# Patient Record
Sex: Female | Born: 1960 | Race: White | Hispanic: No | Marital: Married | Smoking: Former smoker
Health system: Southern US, Community
[De-identification: ages and names within clinical notes are randomized; demographics above are authoritative.]

## PROBLEM LIST (undated history)

## (undated) DIAGNOSIS — F329 Major depressive disorder, single episode, unspecified: Secondary | ICD-10-CM

## (undated) DIAGNOSIS — F419 Anxiety disorder, unspecified: Secondary | ICD-10-CM

## (undated) DIAGNOSIS — F4323 Adjustment disorder with mixed anxiety and depressed mood: Secondary | ICD-10-CM

## (undated) DIAGNOSIS — F1910 Other psychoactive substance abuse, uncomplicated: Secondary | ICD-10-CM

## (undated) DIAGNOSIS — G4730 Sleep apnea, unspecified: Secondary | ICD-10-CM

## (undated) DIAGNOSIS — R21 Rash and other nonspecific skin eruption: Secondary | ICD-10-CM

## (undated) DIAGNOSIS — F1021 Alcohol dependence, in remission: Secondary | ICD-10-CM

## (undated) DIAGNOSIS — I471 Supraventricular tachycardia, unspecified: Secondary | ICD-10-CM

## (undated) DIAGNOSIS — F32A Depression, unspecified: Secondary | ICD-10-CM

## (undated) DIAGNOSIS — R002 Palpitations: Secondary | ICD-10-CM

## (undated) HISTORY — DX: Supraventricular tachycardia: I47.1

## (undated) HISTORY — DX: Other psychoactive substance abuse, uncomplicated: F19.10

## (undated) HISTORY — DX: Depression, unspecified: F32.A

## (undated) HISTORY — DX: Rash and other nonspecific skin eruption: R21

## (undated) HISTORY — DX: Anxiety disorder, unspecified: F41.9

## (undated) HISTORY — DX: Palpitations: R00.2

## (undated) HISTORY — DX: Sleep apnea, unspecified: G47.30

## (undated) HISTORY — DX: Supraventricular tachycardia, unspecified: I47.10

## (undated) HISTORY — DX: Alcohol dependence, in remission: F10.21

## (undated) HISTORY — DX: Adjustment disorder with mixed anxiety and depressed mood: F43.23

---
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
## 1997-12-27 ENCOUNTER — Ambulatory Visit (HOSPITAL_COMMUNITY): Admission: RE | Admit: 1997-12-27 | Discharge: 1997-12-27 | Payer: Self-pay | Admitting: Obstetrics and Gynecology

## 1998-01-31 ENCOUNTER — Encounter: Payer: Self-pay | Admitting: Internal Medicine

## 1998-05-26 ENCOUNTER — Emergency Department (HOSPITAL_COMMUNITY): Admission: EM | Admit: 1998-05-26 | Discharge: 1998-05-26 | Payer: Self-pay | Admitting: Emergency Medicine

## 1998-05-26 ENCOUNTER — Ambulatory Visit (HOSPITAL_COMMUNITY): Admission: RE | Admit: 1998-05-26 | Discharge: 1998-05-26 | Payer: Self-pay | Admitting: Obstetrics and Gynecology

## 1998-08-15 ENCOUNTER — Inpatient Hospital Stay (HOSPITAL_COMMUNITY): Admission: AD | Admit: 1998-08-15 | Discharge: 1998-08-17 | Payer: Self-pay | Admitting: Obstetrics and Gynecology

## 1998-08-22 ENCOUNTER — Encounter (HOSPITAL_COMMUNITY): Admission: RE | Admit: 1998-08-22 | Discharge: 1998-11-20 | Payer: Self-pay | Admitting: Obstetrics and Gynecology

## 1998-09-12 ENCOUNTER — Other Ambulatory Visit: Admission: RE | Admit: 1998-09-12 | Discharge: 1998-09-12 | Payer: Self-pay | Admitting: Obstetrics and Gynecology

## 1999-10-22 ENCOUNTER — Other Ambulatory Visit: Admission: RE | Admit: 1999-10-22 | Discharge: 1999-10-22 | Payer: Self-pay | Admitting: Obstetrics and Gynecology

## 2002-03-16 ENCOUNTER — Other Ambulatory Visit: Admission: RE | Admit: 2002-03-16 | Discharge: 2002-03-16 | Payer: Self-pay | Admitting: Obstetrics and Gynecology

## 2002-11-27 ENCOUNTER — Encounter: Payer: Self-pay | Admitting: Internal Medicine

## 2003-05-09 ENCOUNTER — Other Ambulatory Visit: Admission: RE | Admit: 2003-05-09 | Discharge: 2003-05-09 | Payer: Self-pay | Admitting: Obstetrics and Gynecology

## 2004-08-27 ENCOUNTER — Ambulatory Visit: Payer: Self-pay | Admitting: Internal Medicine

## 2004-09-07 ENCOUNTER — Ambulatory Visit: Payer: Self-pay | Admitting: Internal Medicine

## 2004-09-14 ENCOUNTER — Ambulatory Visit: Payer: Self-pay | Admitting: Internal Medicine

## 2004-09-18 ENCOUNTER — Other Ambulatory Visit: Admission: RE | Admit: 2004-09-18 | Discharge: 2004-09-18 | Payer: Self-pay | Admitting: Obstetrics and Gynecology

## 2004-09-21 ENCOUNTER — Ambulatory Visit: Payer: Self-pay | Admitting: Internal Medicine

## 2006-08-30 ENCOUNTER — Ambulatory Visit: Payer: Self-pay | Admitting: Internal Medicine

## 2006-11-22 ENCOUNTER — Ambulatory Visit: Payer: Self-pay | Admitting: Internal Medicine

## 2006-11-22 DIAGNOSIS — J029 Acute pharyngitis, unspecified: Secondary | ICD-10-CM | POA: Insufficient documentation

## 2006-11-22 LAB — CONVERTED CEMR LAB: Rapid Strep: NEGATIVE

## 2006-11-28 ENCOUNTER — Telehealth: Payer: Self-pay | Admitting: Internal Medicine

## 2007-02-22 ENCOUNTER — Telehealth (INDEPENDENT_AMBULATORY_CARE_PROVIDER_SITE_OTHER): Payer: Self-pay | Admitting: *Deleted

## 2007-04-04 ENCOUNTER — Telehealth: Payer: Self-pay | Admitting: *Deleted

## 2007-04-14 ENCOUNTER — Ambulatory Visit: Payer: Self-pay | Admitting: *Deleted

## 2007-04-19 ENCOUNTER — Ambulatory Visit: Payer: Self-pay | Admitting: *Deleted

## 2007-05-05 ENCOUNTER — Ambulatory Visit: Payer: Self-pay | Admitting: *Deleted

## 2007-05-10 ENCOUNTER — Ambulatory Visit: Payer: Self-pay | Admitting: *Deleted

## 2007-05-17 ENCOUNTER — Ambulatory Visit: Payer: Self-pay | Admitting: *Deleted

## 2007-05-22 ENCOUNTER — Telehealth: Payer: Self-pay | Admitting: Internal Medicine

## 2007-05-31 ENCOUNTER — Ambulatory Visit: Payer: Self-pay | Admitting: *Deleted

## 2007-06-07 ENCOUNTER — Ambulatory Visit: Payer: Self-pay | Admitting: *Deleted

## 2007-06-09 ENCOUNTER — Encounter: Payer: Self-pay | Admitting: Internal Medicine

## 2007-06-21 ENCOUNTER — Ambulatory Visit: Payer: Self-pay | Admitting: *Deleted

## 2007-06-28 ENCOUNTER — Ambulatory Visit: Payer: Self-pay | Admitting: *Deleted

## 2007-07-26 ENCOUNTER — Ambulatory Visit: Payer: Self-pay | Admitting: *Deleted

## 2007-08-07 ENCOUNTER — Telehealth: Payer: Self-pay | Admitting: *Deleted

## 2007-08-29 ENCOUNTER — Telehealth: Payer: Self-pay | Admitting: Internal Medicine

## 2007-09-06 ENCOUNTER — Ambulatory Visit: Payer: Self-pay | Admitting: Internal Medicine

## 2007-09-06 DIAGNOSIS — J069 Acute upper respiratory infection, unspecified: Secondary | ICD-10-CM | POA: Insufficient documentation

## 2007-09-06 DIAGNOSIS — F4323 Adjustment disorder with mixed anxiety and depressed mood: Secondary | ICD-10-CM

## 2007-09-06 DIAGNOSIS — R21 Rash and other nonspecific skin eruption: Secondary | ICD-10-CM

## 2007-09-06 LAB — CONVERTED CEMR LAB: Rapid Strep: NEGATIVE

## 2008-07-29 ENCOUNTER — Telehealth: Payer: Self-pay | Admitting: *Deleted

## 2008-08-21 LAB — CONVERTED CEMR LAB: Pap Smear: NORMAL

## 2008-09-09 ENCOUNTER — Ambulatory Visit: Payer: Self-pay | Admitting: Internal Medicine

## 2008-09-09 DIAGNOSIS — I471 Supraventricular tachycardia: Secondary | ICD-10-CM | POA: Insufficient documentation

## 2008-09-23 LAB — CONVERTED CEMR LAB
ALT: 19 units/L (ref 0–35)
AST: 31 units/L (ref 0–37)
Albumin: 4.3 g/dL (ref 3.5–5.2)
Alkaline Phosphatase: 44 units/L (ref 39–117)
BUN: 24 mg/dL — ABNORMAL HIGH (ref 6–23)
Basophils Absolute: 0 10*3/uL (ref 0.0–0.1)
Basophils Relative: 0 % (ref 0.0–3.0)
Bilirubin, Direct: 0.1 mg/dL (ref 0.0–0.3)
CO2: 30 meq/L (ref 19–32)
Calcium: 10.4 mg/dL (ref 8.4–10.5)
Chloride: 104 meq/L (ref 96–112)
Cholesterol: 250 mg/dL — ABNORMAL HIGH (ref 0–200)
Creatinine, Ser: 0.8 mg/dL (ref 0.4–1.2)
Direct LDL: 132.1 mg/dL
Eosinophils Absolute: 0.1 10*3/uL (ref 0.0–0.7)
Eosinophils Relative: 1.3 % (ref 0.0–5.0)
GFR calc non Af Amer: 81.37 mL/min (ref >60)
Glucose, Bld: 101 mg/dL — ABNORMAL HIGH (ref 70–99)
HCT: 33.4 % — ABNORMAL LOW (ref 36.0–46.0)
HDL: 103.5 mg/dL (ref >39.00)
Hemoglobin: 11.6 g/dL — ABNORMAL LOW (ref 12.0–15.0)
Lymphocytes Relative: 31.5 % (ref 12.0–46.0)
Lymphs Abs: 1.6 10*3/uL (ref 0.7–4.0)
MCHC: 34.8 g/dL (ref 30.0–36.0)
MCV: 102.9 fL — ABNORMAL HIGH (ref 78.0–100.0)
Monocytes Absolute: 0.6 10*3/uL (ref 0.1–1.0)
Monocytes Relative: 12.4 % — ABNORMAL HIGH (ref 3.0–12.0)
Neutro Abs: 2.9 10*3/uL (ref 1.4–7.7)
Neutrophils Relative %: 54.8 % (ref 43.0–77.0)
Platelets: 274 10*3/uL (ref 150.0–400.0)
Potassium: 4.3 meq/L (ref 3.5–5.1)
RBC: 3.25 M/uL — ABNORMAL LOW (ref 3.87–5.11)
RDW: 11.9 % (ref 11.5–14.6)
Sodium: 139 meq/L (ref 135–145)
TSH: 1.37 microintl units/mL (ref 0.35–5.50)
Total Bilirubin: 0.6 mg/dL (ref 0.3–1.2)
Total CHOL/HDL Ratio: 2
Total Protein: 6.7 g/dL (ref 6.0–8.3)
Triglycerides: 75 mg/dL (ref 0.0–149.0)
VLDL: 15 mg/dL (ref 0.0–40.0)
WBC: 5.2 10*3/uL (ref 4.5–10.5)

## 2008-12-05 ENCOUNTER — Telehealth: Payer: Self-pay | Admitting: Internal Medicine

## 2009-01-17 ENCOUNTER — Telehealth: Payer: Self-pay | Admitting: Internal Medicine

## 2009-09-17 ENCOUNTER — Ambulatory Visit: Payer: Self-pay | Admitting: Internal Medicine

## 2009-09-17 DIAGNOSIS — R002 Palpitations: Secondary | ICD-10-CM

## 2010-05-08 ENCOUNTER — Telehealth: Payer: Self-pay | Admitting: *Deleted

## 2010-05-21 ENCOUNTER — Encounter: Payer: Self-pay | Admitting: Internal Medicine

## 2010-05-29 ENCOUNTER — Ambulatory Visit
Admission: RE | Admit: 2010-05-29 | Discharge: 2010-05-29 | Payer: Self-pay | Source: Home / Self Care | Attending: Internal Medicine | Admitting: Internal Medicine

## 2010-05-29 ENCOUNTER — Encounter: Payer: Self-pay | Admitting: Internal Medicine

## 2010-05-29 DIAGNOSIS — R0989 Other specified symptoms and signs involving the circulatory and respiratory systems: Secondary | ICD-10-CM

## 2010-05-29 DIAGNOSIS — R0609 Other forms of dyspnea: Secondary | ICD-10-CM | POA: Insufficient documentation

## 2010-05-29 DIAGNOSIS — D649 Anemia, unspecified: Secondary | ICD-10-CM

## 2010-05-29 DIAGNOSIS — I471 Supraventricular tachycardia: Secondary | ICD-10-CM

## 2010-05-29 DIAGNOSIS — R0683 Snoring: Secondary | ICD-10-CM

## 2010-05-29 DIAGNOSIS — R002 Palpitations: Secondary | ICD-10-CM

## 2010-05-29 NOTE — Progress Notes (Signed)
  Subjective:    Patient ID: Andrea Ortiz, female    DOB: July 08, 1960, 50 y.o.   MRN: [id number] She comes in today for med check .Marland Kitchen She is on metoprolol for  Svt prn palpitations.      This seems to help her symptoms .  NO new cp sob   Needs refill of meds  Also seems to have a chronic problem with snoring recently  A stuffy nose and allergies but  Snoring a good bit.  A sib had a problem with apnea and had to have some kind of nose ENT surgery to correct this.   HPI    Review of Systems Neg for weight change fever anorexia CP sob Asthma symptoms .  No  Bleeding adenopathy vision or hearing changes .   Rest of ros neg or [place]    Objective:   Physical Exam  Constitutional: She is oriented to person, place, and time. Vital signs are normal. She appears well-developed and well-nourished.  Non-toxic appearance. She does not appear ill. No distress.  HENT:  Head: Normocephalic and atraumatic.  Right Ear: External ear normal.  Left Ear: External ear normal.  Nose: Mucosal edema present. No rhinorrhea, sinus tenderness or nasal deformity. Right sinus exhibits no maxillary sinus tenderness. Left sinus exhibits no maxillary sinus tenderness.  Mouth/Throat: Oropharynx is clear and moist. No oropharyngeal exudate.  Eyes: Conjunctivae and EOM are normal. Pupils are equal, round, and reactive to light. Right eye exhibits no discharge. Left eye exhibits no discharge.  Neck: Normal range of motion. Neck supple. No JVD present. No thyromegaly present.  Cardiovascular: Normal rate, regular rhythm, S1 normal, S2 normal, normal heart sounds and intact distal pulses.  Exam reveals no gallop, no friction rub and no decreased pulses.   No murmur heard. Pulmonary/Chest: Effort normal and breath sounds normal. No accessory muscle usage. No respiratory distress. She has no decreased breath sounds. She has no wheezes. She has no rhonchi. She has no rales. She exhibits no tenderness.  Abdominal: Soft. Normal  appearance and bowel sounds are normal. There is no hepatosplenomegaly. There is no tenderness. There is no CVA tenderness.  Musculoskeletal: Normal range of motion.  Lymphadenopathy:    She has no cervical adenopathy.  Neurological: She is alert and oriented to person, place, and time. She has normal strength.  Skin: Skin is warm and dry. She is not diaphoretic. No cyanosis. No pallor. Nails show no clubbing.  Psychiatric: She has a normal mood and affect. Her behavior is normal. Judgment normal. Cognition and memory are normal.          Assessment & Plan:  Palpitations  Svt   On prn meds  Snoring   Add INCS and get ent check  All rhinits    Anemia mild and take oral iron   See Centricity.

## 2010-05-31 LAB — CONVERTED CEMR LAB
ALT: 18 units/L (ref 0–35)
AST: 22 units/L (ref 0–37)
Albumin: 4.5 g/dL (ref 3.5–5.2)
Alkaline Phosphatase: 32 units/L — ABNORMAL LOW (ref 39–117)
BUN: 14 mg/dL (ref 6–23)
Basophils Absolute: 0 10*3/uL (ref 0.0–0.1)
Basophils Relative: 0.6 % (ref 0.0–3.0)
Bilirubin, Direct: 0.1 mg/dL (ref 0.0–0.3)
CO2: 27 meq/L (ref 19–32)
Calcium: 9.4 mg/dL (ref 8.4–10.5)
Chloride: 102 meq/L (ref 96–112)
Cholesterol: 245 mg/dL — ABNORMAL HIGH (ref 0–200)
Creatinine, Ser: 0.7 mg/dL (ref 0.4–1.2)
Direct LDL: 137.9 mg/dL
Eosinophils Absolute: 0.1 10*3/uL (ref 0.0–0.7)
Eosinophils Relative: 1.7 % (ref 0.0–5.0)
Free T4: 0.7 ng/dL (ref 0.6–1.6)
GFR calc non Af Amer: 94.52 mL/min (ref >60)
Glucose, Bld: 89 mg/dL (ref 70–99)
HCT: 32.9 % — ABNORMAL LOW (ref 36.0–46.0)
HDL: 98.2 mg/dL (ref >39.00)
Hemoglobin: 11.4 g/dL — ABNORMAL LOW (ref 12.0–15.0)
Lymphocytes Relative: 35 % (ref 12.0–46.0)
Lymphs Abs: 1.8 10*3/uL (ref 0.7–4.0)
MCHC: 34.7 g/dL (ref 30.0–36.0)
MCV: 103.2 fL — ABNORMAL HIGH (ref 78.0–100.0)
Monocytes Absolute: 0.6 10*3/uL (ref 0.1–1.0)
Monocytes Relative: 11.7 % (ref 3.0–12.0)
Neutro Abs: 2.6 10*3/uL (ref 1.4–7.7)
Neutrophils Relative %: 51 % (ref 43.0–77.0)
Platelets: 294 10*3/uL (ref 150.0–400.0)
Potassium: 4.1 meq/L (ref 3.5–5.1)
RBC: 3.19 M/uL — ABNORMAL LOW (ref 3.87–5.11)
RDW: 13.5 % (ref 11.5–14.6)
Sodium: 137 meq/L (ref 135–145)
T3, Free: 2 pg/mL — ABNORMAL LOW (ref 2.3–4.2)
TSH: 2.12 microintl units/mL (ref 0.35–5.50)
Total Bilirubin: 0.4 mg/dL (ref 0.3–1.2)
Total CHOL/HDL Ratio: 2
Total Protein: 6.8 g/dL (ref 6.0–8.3)
Triglycerides: 95 mg/dL (ref 0.0–149.0)
VLDL: 19 mg/dL (ref 0.0–40.0)
WBC: 5 10*3/uL (ref 4.5–10.5)

## 2010-06-04 NOTE — Progress Notes (Signed)
Summary: Pt req partial refill of Metoprolol to last til ov on 05/29/10  Phone Note Call from Patient Call back at [facility] Phone [phone]   Caller: Patient Summary of Call: Pt has sch a med check ov for 05/29/10 at 2:30pm. Pt will run out of Metoprolol before then and is req a partial to last until appt date. Pls call in to [facility] in [place] [phone] Initial call taken by: Lucy Antigua,  May 08, 2010 1:36 PM  Follow-up for Phone Call        Rx sent to pharmacy. Follow-up by: Romualdo Bolk, CMA (AAMA),  May 08, 2010 2:36 PM    Prescriptions: METOPROLOL TARTRATE 50 MG TABS (METOPROLOL TARTRATE) 1 by mouth once daily  #30 Tablet x 0   Entered by:   Romualdo Bolk, CMA (AAMA)   Authorized by:   Madelin Headings MD   Signed by:   Romualdo Bolk, CMA (AAMA) on 05/08/2010   Method used:   Electronically to        [facility]  [phone]* (retail)       [address]       [place], [place]  [address]       Ph: [phone]       Fax: [phone]   RxID:   [id number]

## 2010-06-04 NOTE — Assessment & Plan Note (Signed)
Summary: med check/refill/cjr   Vital Signs:  Patient profile:   50 year old female Menstrual status:  irregular Weight:      149 pounds BP sitting:   120 / 80  (right arm) CC: Med Recheck/ Refills   History of Present Illness: Andrea Ortiz comesin comes in today  for  follow up of meds for SVT.  Taking  med  metoprolol 50 more often  than usual  and   irregular more than  erratic.  End takes up meds as needed  about 5 days per week .  This is not like racing but more irreg beat .Marland Kitchen No change in activity tolerance and no cp sob. No wakenings   . Had lost  weight  with  .life style intervention and then put it back on.   Allergies on med . No asthma cough.   no other new med problems.   Preventive Screening-Counseling & Management  Alcohol-Tobacco     Alcohol drinks/day: <1     Smoking Status: quit     Year Quit: 12 years ago  Caffeine-Diet-Exercise     Caffeine use/day: 1     Does Patient Exercise: yes     Type of exercise: walking, pilates, yoga and wts     Times/week: 5  Current Medications (verified): 1)  Metoprolol Tartrate 50 Mg Tabs (Metoprolol Tartrate) .Marland Kitchen.. 1 By Mouth Once Daily 2)  Fexofenadine Hcl 180 Mg  Tabs (Fexofenadine Hcl) .Marland Kitchen.. 1 By Mouth Once Daily. Pt Needs To Schedule A Follow Up Appt Before Next Refill. 3)  Desoximetasone 0.05 % Crea (Desoximetasone) .... Apply Two Times A Day For 2 Weeks At A Time  Allergies (verified): 1)  * Lexapro  Past History:  Past medical, surgical, family and social histories (including risk factors) reviewed, and no changes noted (except as noted below).  Past Medical History: Reviewed history from 09/09/2008 and no changes required. 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   cards eval 2000 Childbirth G4 P1  remote hx of eating disorder  and ocd when younger   Family History: Reviewed history from 09/09/2008 and no changes required. Family History of Alcoholism/Addiction Family History Lung cancer Mom alzheimers     Social History: Reviewed history from 09/09/2008 and no changes required. Married   separated Former Lobbyist    Works [employer]  Caffeine use/day:  1  Review of Systems  The patient denies anorexia, fever, weight loss, weight gain, vision loss, decreased hearing, chest pain, syncope, dyspnea on exertion, peripheral edema, prolonged cough, abdominal pain, melena, hematochezia, severe indigestion/heartburn, hematuria, transient blindness, difficulty walking, depression, abnormal bleeding, enlarged lymph nodes, and angioedema.    Physical Exam  General:  Well-developed,well-nourished,in no acute distress; alert,appropriate and cooperative throughout examination Head:  normocephalic and atraumatic.   Eyes:  vision grossly intact, pupils equal, and pupils round.   Ears:  R ear normal, L ear normal, and no external deformities.   Neck:  No deformities, masses, or tenderness noted. Lungs:  Normal respiratory effort, chest expands symmetrically. Lungs are clear to auscultation, no crackles or wheezes. Heart:  Normal rate and regular rhythm. S1 and S2 normal without gallop, murmur, click, rub or other extra sounds.no lifts.   Abdomen:  Bowel sounds positive,abdomen soft and non-tender without masses, organomegaly or   noted. Pulses:  pulses intact without delay   Extremities:  nl cap refill  Neurologic:  non  focal   Skin:  turgor normal, color normal, no ecchymoses, no petechiae, and no  purpura.   Cervical Nodes:  No lymphadenopathy noted Psych:  Oriented X3, normally interactive, good eye contact, not anxious appearing, and not depressed appearing.   EKG nsr   no acute changes   Impression & Recommendations:  Problem # 1:  PALPITATIONS, RECURRENT (ICD-785.1) these soiund more like premature beats than svt.   r/o metabolic derangement. Her updated medication list for this problem includes:    Metoprolol Tartrate 50 Mg Tabs (Metoprolol tartrate) .Marland Kitchen... 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 1 by mouth once daily  Orders: TLB-CBC Platelet - w/Differential (85025-CBCD) TLB-BMP (Basic Metabolic Panel-BMET) (80048-METABOL) TLB-Hepatic/Liver Function Pnl (80076-HEPATIC) TLB-TSH (Thyroid Stimulating Hormone) (84443-TSH) TLB-Lipid Panel (80061-LIPID) TLB-T4 (Thyrox), Free (84439-FT4R) TLB-T3, Free (Triiodothyronine) (84481-T3FREE) EKG w/ Interpretation (93000)  Problem # 3:  ADJ DISORDER WITH MIXED ANXIETY & DEPRESSED MOOD (ICD-309.28)  Complete Medication List: 1)  Metoprolol Tartrate 50 Mg Tabs (Metoprolol tartrate) .Marland Kitchen.. 1 by mouth once daily 2)  Fexofenadine Hcl 180 Mg Tabs (Fexofenadine hcl) .Marland Kitchen.. 1 by mouth once daily. pt needs to schedule a follow up appt before next refill. 3)  Desoximetasone 0.05 % Crea (Desoximetasone) .... Apply two times a day for 2 weeks at a time EKG  Sinus rhythym and nl intervals   sinus brady 58 .  Patient Instructions: 1)  take medication every day  for now 2)  You will be informed of lab results when available.  3)  return office visit in 1-2 months   4)  if persistent or  progressive  consider see cardiology again about opinion  Prescriptions: METOPROLOL TARTRATE 50 MG TABS (METOPROLOL TARTRATE) 1 by mouth once daily  #30 Each x 6   Entered and Authorized by:   Madelin Headings MD   Signed by:   Madelin Headings MD on 09/17/2009   Method used:   Electronically to        [facility]  [phone]*  (retail)       [address]       [place], [place]  [address]       Ph: [phone]       Fax: [phone]   RxID:   [id number]

## 2010-06-06 DIAGNOSIS — D649 Anemia, unspecified: Secondary | ICD-10-CM | POA: Insufficient documentation

## 2010-06-06 DIAGNOSIS — R0683 Snoring: Secondary | ICD-10-CM | POA: Insufficient documentation

## 2010-06-06 NOTE — Patient Instructions (Signed)
See centricity   Patient Instructions: 1)  begin flonase will do ent referral for the snoring. 2)  take an iron supplement  .Marland Kitchen Hg is 11.8  3)  cpx with labs in 7 months

## 2010-06-08 ENCOUNTER — Other Ambulatory Visit: Payer: Self-pay | Admitting: Internal Medicine

## 2010-06-10 NOTE — Assessment & Plan Note (Signed)
Summary: med check/refill/cjr  3-4 x  pwer week.   and using prm after minute  Allegra doing ok. bro sleep apnes  and rx with ent procedure.  Allergies: 1)  * Lexapro note in epic.  Impression & Recommendations:  Problem # 1:  SNORING (ICD-786.09)  see note in epic.  Her updated medication list for this problem includes:    Metoprolol Tartrate 50 Mg Tabs (Metoprolol tartrate) .Marland Kitchen... 1 by mouth once daily  Orders: ENT Referral (ENT)  Problem # 2:  svt using as needed meds   Problem # 3:  ANEMIA (ICD-285.9) h x of same ...  [place]  now is perimenopauseal Orders: Hgb (85018) Fingerstick ([address])  Complete Medication List: 1)  Metoprolol Tartrate 50 Mg Tabs (Metoprolol tartrate) .Marland Kitchen.. 1 by mouth once daily 2)  Fexofenadine Hcl 180 Mg Tabs (Fexofenadine hcl) .Marland Kitchen.. 1 by mouth once daily. pt needs to schedule a follow up appt before next refill. 3)  Desoximetasone 0.05 % Crea (Desoximetasone) .... Apply two times a day for 2 weeks at a time 4)  Fluticasone Propionate 50 Mcg/act Susp (Fluticasone propionate) .... 2 spray each nostril once daily  Patient Instructions: 1)  begin flonase will do ent referral for the snoring. 2)  take an iron supplement  .Marland Kitchen Hg is 11.8  3)  cpx with labs in 7 months  Prescriptions: METOPROLOL TARTRATE 50 MG TABS (METOPROLOL TARTRATE) 1 by mouth once daily  #30 Tablet x 6   Entered and Authorized by:   Madelin Headings MD   Signed by:   Madelin Headings MD on 05/29/2010   Method used:   Electronically to        [facility]  [phone]* (retail)       [address]       [place], [place]  [address]       Ph: [phone]       Fax: [phone]   RxID:   [id number] FLUTICASONE PROPIONATE 50 MCG/ACT SUSP (FLUTICASONE PROPIONATE) 2 spray each nostril once daily  #1 x 3   Entered and Authorized by:   Madelin Headings MD   Signed by:   Madelin Headings MD on 05/29/2010   Method used:   Electronically to        [facility]  [phone]* (retail)       [address]       [place], [place]  [address]       Ph: [phone]       Fax: [phone]   RxID:   [id number]    Orders Added: 1)  Hgb [85018] 2)  Fingerstick [36416] 3)  ENT Referral [ENT] 4)  Est. Patient Level IV [93235]    Laboratory Results   CBC   HGB:  11.8 g/dL   (Normal Range: 57.3-22.0 in Males, 12.0-15.0 in Females) Comments: Andrea Ortiz  May 29, 2010 3:21 PM    Patient ID: Andrea Ortiz, female DOB: 04-07-1961, 50 y.o. MRN: [id number]  She comes in today for med check .Marland Kitchen She is on metoprolol for Svt prn palpitations. This seems to help her symptoms . NO new cp sob Needs refill of meds Also seems to have a chronic problem with snoring recently A stuffy nose and allergies but Snoring a good bit. A sib had a problem with apnea and had to have some kind of nose ENT surgery to correct this. HPI  Review of Systems  Neg for weight change fever anorexia CP sob Asthma symptoms .  No Bleeding adenopathy vision or hearing changes . Rest of ros neg or [place]   Objective: Physical Exam Constitutional: She is oriented to person, place, and time. Vital signs are normal. She appears well-developed and well-nourished. Non-toxic appearance. She does not appear ill. No distress. HENT: Head: Normocephalic and atraumatic. Right Ear: External ear normal. Left Ear: External ear normal. Nose: Mucosal edema present. No rhinorrhea, sinus tenderness or nasal deformity. Right sinus exhibits no maxillary sinus tenderness. Left sinus exhibits no maxillary sinus tenderness. Mouth/Throat: Oropharynx is clear and moist. No oropharyngeal exudate. Eyes: Conjunctivae and EOM are normal. Pupils are equal, round, and reactive to light. Right eye exhibits no discharge. Left eye exhibits no discharge. Neck: Normal range of motion. Neck supple. No JVD present. No thyromegaly present. Cardiovascular: Normal rate, regular rhythm, S1 normal, S2 normal, normal heart sounds and intact distal pulses.  Exam reveals no gallop, no friction rub and no decreased pulses. No murmur heard.  Pulmonary/Chest: Effort normal and breath sounds normal. No accessory muscle usage. No respiratory distress. She has no decreased breath sounds. She has no wheezes. She has no rhonchi. She has no rales. She exhibits no tenderness. Abdominal: Soft. Normal appearance and bowel sounds are normal. There is no hepatosplenomegaly. There is no tenderness. There is no CVA tenderness. Musculoskeletal: Normal range of motion. Lymphadenopathy: She has no cervical adenopathy. Neurological: She is alert and oriented to person, place, and time. She has normal strength. Skin: Skin is warm and dry. She is not diaphoretic. No cyanosis. No pallor. Nails show no clubbing. Psychiatric: She has a normal mood and affect. Her behavior is normal. Judgment normal. Cognition and memory are normal.   Assessment & Plan: Palpitations Svt On prn meds Snoring Add INCS and get ent check All rhinits Anemia mild and take oral iron See Centricity.

## 2010-08-09 ENCOUNTER — Emergency Department (HOSPITAL_BASED_OUTPATIENT_CLINIC_OR_DEPARTMENT_OTHER)
Admission: EM | Admit: 2010-08-09 | Discharge: 2010-08-09 | Disposition: A | Discharge status: Home / Self Care | Payer: 59 | Attending: Emergency Medicine | Admitting: Emergency Medicine

## 2010-08-09 DIAGNOSIS — R002 Palpitations: Secondary | ICD-10-CM | POA: Insufficient documentation

## 2010-08-09 LAB — CBC
Hemoglobin: 11.4 g/dL — ABNORMAL LOW (ref 12.0–15.0)
MCH: 32.7 pg (ref 26.0–34.0)
RBC: 3.49 MIL/uL — ABNORMAL LOW (ref 3.87–5.11)

## 2010-08-09 LAB — DIFFERENTIAL
Basophils Relative: 1 % (ref 0–1)
Lymphocytes Relative: 33 % (ref 12–46)
Lymphs Abs: 1.5 10*3/uL (ref 0.7–4.0)
Monocytes Relative: 11 % (ref 3–12)
Neutro Abs: 2.4 10*3/uL (ref 1.7–7.7)
Neutrophils Relative %: 54 % (ref 43–77)

## 2010-08-09 LAB — BASIC METABOLIC PANEL
CO2: 25 mEq/L (ref 19–32)
Chloride: 105 mEq/L (ref 96–112)
Creatinine, Ser: 0.7 mg/dL (ref 0.4–1.2)
GFR calc Af Amer: >60 mL/min (ref >60)

## 2010-08-10 ENCOUNTER — Telehealth: Payer: Self-pay | Admitting: *Deleted

## 2010-08-10 NOTE — Telephone Encounter (Signed)
[facility] Triage Call Report Triage Record Num: [id number] Operator: Ether Griffins Patient Name: Andrea Ortiz Call Date & Time: 08/09/2010 11:26:30AM Patient Phone: [phone] PCP: Neta Mends. Panosh Patient Gender: Female PCP Fax : [phone] Patient DOB: 1960-10-17 Practice Name: [facility] Reason for Call: Calling about having heart skipping a beat and SOB--has been happening often since last night.Onset 08/09/10.Takes Metoprolol.Some lightheadedness but denies CP.Advised to go to ED. Protocol(s) Used: Irregular Heartbeat Recommended Outcome per Protocol: See ED Immediately Reason for Outcome: Pulse rate is suddenly more irregular or suddenly >90 beats/minute at rest AND known coronary artery disease (history of MI, angina or cardiac surgery) Care Advice: ~ Another adult should drive. ~ IMMEDIATE ACTION Write down provider's name. List or place the following in a bag for transport with the patient: current prescription and/or nonprescription medications; alternative treatments, therapies and medications; and street drugs. ~ ~ Place person in a position of comfort and loosen tight clothing. Call EMS 911 if having chest pain, sudden severe shortness of breath, loss of consciousness, or signs of shock (such as unable to stand due to faintness, dizziness, or lightheadedness; new onset of confusion; slow to respond or difficult to awaken; skin is pale, gray, cool, or moist to touch; severe weakness). ~ 08/09/2010 11:35:26AM Page 1 of 1 [id number]

## 2010-08-10 NOTE — Telephone Encounter (Signed)
Pt is taking Metoprolol and appt was given next week.

## 2010-08-10 NOTE — Telephone Encounter (Signed)
Pt was seen in the ER at [facility] for SVT and was diagnosed with benign palpitations.  Is asking if we need any records or appt for her???

## 2010-08-10 NOTE — Telephone Encounter (Signed)
Per Dr. Fabian Sharp- If doing okay then ov next week. Make sure she is taking metoprolol daily.

## 2010-08-12 ENCOUNTER — Encounter: Payer: Self-pay | Admitting: Family Medicine

## 2010-08-12 ENCOUNTER — Ambulatory Visit (INDEPENDENT_AMBULATORY_CARE_PROVIDER_SITE_OTHER): Payer: 59 | Admitting: Family Medicine

## 2010-08-12 VITALS — BP 120/62 | Temp 98.4°F | Ht 65.75 in | Wt 146.0 lb

## 2010-08-12 DIAGNOSIS — I471 Supraventricular tachycardia: Secondary | ICD-10-CM

## 2010-08-12 DIAGNOSIS — R002 Palpitations: Secondary | ICD-10-CM

## 2010-08-12 NOTE — Patient Instructions (Signed)
Gradually reduce caffeine use. Take Lopressor 50 mg one half tablet twice daily.

## 2010-08-12 NOTE — Progress Notes (Signed)
  Subjective:    Patient ID: Andrea Ortiz, female    DOB: 1961-02-20, 50 y.o.   MRN: [id number]  HPI Patient seen for followup with chief complaint of some palpitations over the past few days. Onset last Thursday. Symptoms at rest. Prior history of PSVT that these symptoms are different. Sensation of occasional skipped or irregular heartbeat. Still exercising without difficulty. No associated chest pain, syncope, or dizziness.  Patient went to emergency department and monitoring was unremarkable. EKG normal sinus rhythm. Electrolytes normal. CBC hemoglobin 11.4 consistent with prior values no other changes. Previous thyroid functions normal. No recent symptoms of weight loss, diarrhea, or tremors.  Drinks caffeine in the form of coffee 2 cups per day and occasional green tea. No recent dietary changes.  She takes Lopressor 50 mg once daily at baseline. Increased on her own to 2 tablets once daily but has had some increased fatigue and feeling less focused since then. She has not taken her pulse.   Review of Systems  Constitutional: Positive for fatigue. Negative for fever, chills, diaphoresis, appetite change and unexpected weight change.  HENT: Negative for neck stiffness.   Respiratory: Negative for cough, chest tightness and shortness of breath.   Cardiovascular: Positive for palpitations. Negative for chest pain and leg swelling.  Gastrointestinal: Negative for abdominal pain.  Genitourinary: Negative for dysuria.  Neurological: Negative for dizziness, syncope, weakness and headaches.  Hematological: Negative for adenopathy.       Objective:   Physical Exam  Constitutional: She is oriented to person, place, and time. She appears well-developed and well-nourished.  HENT:  Head: Normocephalic.  Right Ear: External ear normal.  Left Ear: External ear normal.  Mouth/Throat: Oropharynx is clear and moist. No oropharyngeal exudate.  Eyes: Pupils are equal, round, and reactive to  light.  Neck: Neck supple. No thyromegaly present.  Cardiovascular: Normal rate, regular rhythm and normal heart sounds.  Exam reveals no gallop.   No murmur heard. Pulmonary/Chest: Effort normal and breath sounds normal. No respiratory distress. She has no wheezes. She has no rales.  Musculoskeletal: She exhibits no edema.  Lymphadenopathy:    She has no cervical adenopathy.  Neurological: She is alert and oriented to person, place, and time.  Psychiatric: She has a normal mood and affect.          Assessment & Plan:  #1 palpitations. No evidence for recurrent PSVT. Recent lab work unremarkable. Gradually reduce caffeine use. Suggested Lopressor 50 mg one half tablet twice a day. Followup with cardiologist or consideration of Holter monitor if symptoms persist #2 history of PSVT with no recent occurrence

## 2010-08-18 ENCOUNTER — Ambulatory Visit: Payer: 59 | Admitting: Internal Medicine

## 2010-11-06 ENCOUNTER — Telehealth: Payer: Self-pay | Admitting: *Deleted

## 2010-11-06 MED ORDER — FLUCONAZOLE 150 MG PO TABS: 150.0000 mg | ORAL_TABLET | Freq: Once | ORAL | Status: AC

## 2010-11-06 NOTE — Telephone Encounter (Signed)
Ok to do Diflucan 150 mg   X 1

## 2010-11-06 NOTE — Telephone Encounter (Signed)
Notified pt. 

## 2010-11-06 NOTE — Telephone Encounter (Signed)
Pt states she is sure that she has a yeast infection, and took one day Monistat treatment which did not work.  Wants suggestions, and will use a longer treatment of Monistat if she needs to .

## 2010-11-11 ENCOUNTER — Telehealth: Payer: Self-pay | Admitting: *Deleted

## 2010-11-11 MED ORDER — TERCONAZOLE 0.8 % VA CREA: 1.0000 | TOPICAL_CREAM | Freq: Every day | VAGINAL | Status: AC

## 2010-11-11 NOTE — Telephone Encounter (Signed)
Pt took the one Diflucan, but is still having vaginal itching.  Asking for extended treatment of Diflucan or whatever Dr. Fabian Sharp recommends.  No discharge of any other symptoms.

## 2010-11-11 NOTE — Telephone Encounter (Signed)
Left message on personally identified voicemail to notify pt rx sent to pharmacy

## 2010-11-11 NOTE — Telephone Encounter (Signed)
terazole. vaginal  And if not better then needs ov evaluation.

## 2011-01-06 ENCOUNTER — Encounter: Payer: Self-pay | Admitting: Internal Medicine

## 2011-01-07 ENCOUNTER — Ambulatory Visit: Payer: 59 | Admitting: Internal Medicine

## 2011-01-07 ENCOUNTER — Ambulatory Visit (INDEPENDENT_AMBULATORY_CARE_PROVIDER_SITE_OTHER): Payer: 59 | Admitting: Internal Medicine

## 2011-01-07 ENCOUNTER — Encounter: Payer: Self-pay | Admitting: Internal Medicine

## 2011-01-07 VITALS — BP 120/80 | HR 78 | Wt 138.0 lb

## 2011-01-07 DIAGNOSIS — R002 Palpitations: Secondary | ICD-10-CM

## 2011-01-07 DIAGNOSIS — M5440 Lumbago with sciatica, unspecified side: Secondary | ICD-10-CM | POA: Insufficient documentation

## 2011-01-07 DIAGNOSIS — R209 Unspecified disturbances of skin sensation: Secondary | ICD-10-CM

## 2011-01-07 DIAGNOSIS — M5430 Sciatica, unspecified side: Secondary | ICD-10-CM

## 2011-01-07 DIAGNOSIS — R202 Paresthesia of skin: Secondary | ICD-10-CM | POA: Insufficient documentation

## 2011-01-07 DIAGNOSIS — D649 Anemia, unspecified: Secondary | ICD-10-CM

## 2011-01-07 LAB — CBC WITH DIFFERENTIAL/PLATELET
Basophils Relative: 0.6 % (ref 0.0–3.0)
Eosinophils Absolute: 0.1 10*3/uL (ref 0.0–0.7)
Eosinophils Relative: 1.1 % (ref 0.0–5.0)
Hemoglobin: 11.9 g/dL — ABNORMAL LOW (ref 12.0–15.0)
Lymphocytes Relative: 30.1 % (ref 12.0–46.0)
MCHC: 33.7 g/dL (ref 30.0–36.0)
Monocytes Relative: 9.9 % (ref 3.0–12.0)
Neutro Abs: 2.7 10*3/uL (ref 1.4–7.7)
Neutrophils Relative %: 58.3 % (ref 43.0–77.0)
RBC: 3.43 Mil/uL — ABNORMAL LOW (ref 3.87–5.11)
WBC: 4.6 10*3/uL (ref 4.5–10.5)

## 2011-01-07 LAB — TSH: TSH: 1.35 u[IU]/mL (ref 0.35–5.50)

## 2011-01-07 LAB — T4, FREE: Free T4: 0.67 ng/dL (ref 0.60–1.60)

## 2011-01-07 LAB — SEDIMENTATION RATE: Sed Rate: 14 mm/hr (ref 0–22)

## 2011-01-07 MED ORDER — METOPROLOL TARTRATE 50 MG PO TABS: 50.0000 mg | ORAL_TABLET | Freq: Every day | ORAL | Status: DC

## 2011-01-07 NOTE — Patient Instructions (Signed)
Will notify you  of labs when available. Will get you a cards referral.  Also will plan and evaluation for the tingling and spinal pain. This seems like a pinched nerve  As opposed to other disease.

## 2011-01-07 NOTE — Progress Notes (Signed)
  Subjective:    Patient ID: Andrea Ortiz, female    DOB: 03/29/1961, 50 y.o.   MRN: [id number]  HPI Comes in today for follow up of continued intermittent palpitations. She is now taking b blocker every day split bid dosing. No se of meds .   Unclear if helps . No syncope chest pain cough . No bleeding or bruising . Worse when stressed .   Caffine in am  A lot in the past less now. Exercise 5 days  Per week.   Not worse with exercise .  See ed visit from the spring. Remote hx of svt when pregnant in the past.  Also concerned about right back hip area  Pain the ocass radiated to leg.  Intermittently gets tingling electric feeling various limbs but no weakness or all at the same time.    Of turns neck a certain way the gets tingling and pain down right arm.  No hx of traumea or djd disc disease. No bowel or bladder associated sx.  Hand eczema    Review of Systems Neg fever gi gu issues hearing vision change weakness  Still snores some  No OSA sx . See hpi  Past history family history social history reviewed in the electronic medical record.     Objective:   Physical Exam Physical Exam: Vital signs reviewed WUJ:[id number] is a well-developed well-nourished alert cooperative  white female who appears her stated age in no acute distress.  HEENT: normocephalic  atraumatic , Eyes: PERRL EOM's full, conjunctiva clear, Nares: paten,t no deformity discharge or tenderness., Ears: no deformity EAC's clear TMs with normal landmarks. Mouth: clear OP, no lesions, edema.  Moist mucous membranes. Dentition in adequate repair. NECK: supple without masses, thyromegaly or bruits. CHEST/PULM:  Clear to auscultation and percussion breath sounds equal no wheeze , rales or rhonchi. No chest wall deformities or tenderness. CV: PMI is nondisplaced, S1 S2 no gallops, murmurs, rubs. Peripheral pulses are full without delay.No JVD .  ABDOMEN: Bowel sounds normal nontender  No guard or rebound, no hepato splenomegal  no CVA tenderness.   Extremtities:  No clubbing cyanosis or edema, no acute joint swelling or redness no focal atrophy NEURO:  Oriented x3, cranial nerves 3-12 appear to be intact, no obvious focal weakness,gait within normal limits no abnormal reflexes  Sensation not tested  SKIN: No acute rashes normal turgor, color, no bruising or petechiae. PSYCH: Oriented, good eye contact, no obvious depression anxiety, cognition and judgment appear normal.      Assessment & Plan:  Palpitations recurrent; Sound like premature beats .    Hx of svt in remote past associated with preg.  Had ed visit and no thyroid tests done at that time. No  Other sx  .  Disc options  Will get  Cards opinion about advisability of further eval.  May want to limit  Caffiene.   Back neck pain with various radiations  That sound neuro. No current weakness or severity but  Problematic at times .

## 2011-01-11 ENCOUNTER — Telehealth: Payer: Self-pay | Admitting: *Deleted

## 2011-01-11 ENCOUNTER — Encounter: Payer: Self-pay | Admitting: Neurology

## 2011-01-11 DIAGNOSIS — D649 Anemia, unspecified: Secondary | ICD-10-CM

## 2011-01-11 NOTE — Telephone Encounter (Signed)
Left message on machine to call back and schedule a lab appt. I also left a message on machine about results.

## 2011-01-11 NOTE — Telephone Encounter (Signed)
Message copied by Romualdo Bolk on Mon Jan 11, 2011  9:25 AM ------      Message from: [facility], [place] [facility]      Created: Mon Jan 11, 2011  8:28 AM       Tell patient normal labs including thyroid  except  For some anemia .             Advise  : CBCdiff ,IBC panel , ferritin and  VIT B12 and Folic acid level :  Dx anemia .      Then decide further follow up .     ( consults are pending)

## 2011-01-12 ENCOUNTER — Ambulatory Visit: Payer: 59 | Admitting: Internal Medicine

## 2011-01-20 ENCOUNTER — Ambulatory Visit (INDEPENDENT_AMBULATORY_CARE_PROVIDER_SITE_OTHER): Payer: 59 | Admitting: Neurology

## 2011-01-20 ENCOUNTER — Encounter: Payer: Self-pay | Admitting: Neurology

## 2011-01-20 VITALS — BP 126/84 | HR 64 | Ht 67.0 in | Wt 139.0 lb

## 2011-01-20 DIAGNOSIS — M545 Low back pain, unspecified: Secondary | ICD-10-CM

## 2011-01-20 NOTE — Progress Notes (Signed)
Dear Dr. Fabian Sharp,  Thank you for having me see Andrea Ortiz in consultation today at [facility] for her problem with lower back pain.  As you may recall, she is a 50 y.o. year old female with a benign medical history who presents with worsening lower back pain with bilateral numbness and tingling of legs on exam.  She says that it has been getting progressively worse over the last 6 months.  It is relieved by bending forward and she doesn't have any bowel or bladder incontinence.  There was no precipitating trauma.  Coughing does not make it worse.  She describes the numbness and tingling as being bilateral, worse on the left outer part of the foot.  Medical History: Supraventricular tachycardia.  Surgical History: Rhinoplasty   Social History: Works as a Librarian, academic.  Social EtOH. No Tob.  Former Education officer, community.   Family History: History of dementia and parkinson's disease.  No other neurologic diseases.   ROS:  13 systems were reviewed and are notable for leg pain while walking, anxiety, headaches.  She describes the headaches as bifrontal, without photo or phonophobia and relieved with advil.  All other review of systems are unremarkable.   Examination:  Filed Vitals:   01/20/11 0808  BP: 126/84  Pulse: 64  Height: 5\' 7"  (1.702 m)  Weight: 139 lb (63.05 kg)     In general, well appearing middle aged woman.  Back:  No neck tenderness to palpation of step deformities.  Same for lower back.  Decreased ROM of neck particularly in retroflexion.  Cardiovascular: The patient has a regular rate and rhythm and no carotid bruits.  Fundoscopy:  Disks are flat. Vessel caliber within normal limits.  Mental status:   The patient is oriented to person, place and time. Recent and remote memory are intact. Attention span and concentration are normal. Language including repetition, naming, following commands are intact. Fund of knowledge of current and historical events,  as well as vocabulary are normal.  Cranial Nerves: Pupils are equally round and reactive to light. Visual fields full to confrontation. Extraocular movements are intact without nystagmus. Facial sensation and muscles of mastication are intact. Muscles of facial expression are symmetric. Hearing intact to bilateral finger rub. Tongue protrusion, uvula, palate midline.  Shoulder shrug intact  Motor:  The patient has normal bulk and tone, no pronator drift and 5/5 strength bilaterally.  There are no adventitious movements.  Reflexes:  Are 2+ bilaterally in both the upper and lower extremities.  Except 1+ in the left ankle.  Toes down.  Coordination:  Normal finger to nose.  No dysdiadokinesia.  Sensation is decreased to temperature in the left L5, S1 distribution.  Gait and Station are normal.  Tandem gait is intact.  Romberg is negative   Impression: Likely lumbosacral radiculopathy.  There is a hint of neurogenic claudication here so I am a little worried about lumbar stenosis.   Recommendations:  1.  MRI of the L-spine 2.  Left leg EMG/NCS   We will see the patient back in 1 month after her investigations.  Thank you for having [place] see this patient in consultation.  Feel free to contact me with any questions.  Andrea Raider Modesto Charon, MD [facility],  [address] [place], [place] [address] Phone: [phone] Fax: [phone].

## 2011-01-20 NOTE — Patient Instructions (Signed)
Your MRI has been scheduled for Tuesday, Sept. 25th at 8:00am please arrive to [facility] by 7:45am.  The nerve conduction studies/electromyelogram is scheduled for Monday, October 8th at 9:45am.  [facility] [address] [place].  [phone].

## 2011-01-26 ENCOUNTER — Inpatient Hospital Stay (HOSPITAL_COMMUNITY): Admission: RE | Admit: 2011-01-26 | Payer: 59 | Source: Ambulatory Visit

## 2011-01-27 ENCOUNTER — Other Ambulatory Visit: Payer: Self-pay | Admitting: Internal Medicine

## 2011-02-03 ENCOUNTER — Institutional Professional Consult (permissible substitution): Payer: 59 | Admitting: Cardiovascular Disease

## 2011-02-09 ENCOUNTER — Encounter: Payer: Self-pay | Admitting: Cardiovascular Disease

## 2011-02-09 ENCOUNTER — Ambulatory Visit (INDEPENDENT_AMBULATORY_CARE_PROVIDER_SITE_OTHER): Payer: 59 | Admitting: Cardiovascular Disease

## 2011-02-09 DIAGNOSIS — R002 Palpitations: Secondary | ICD-10-CM

## 2011-02-09 DIAGNOSIS — R079 Chest pain, unspecified: Secondary | ICD-10-CM | POA: Insufficient documentation

## 2011-02-09 DIAGNOSIS — I471 Supraventricular tachycardia: Secondary | ICD-10-CM

## 2011-02-09 NOTE — Progress Notes (Signed)
Pleasant 50 yo referred by Panosh for palpitaitons.  Lots of recent changes with new job, new hom, new marriage and going through [place] with hot flashes.  History of SVT 12 years ago broke in ER with lopresser.  Was pregnant at time.  Since then has had flairs of palpitaitons intermitantly.  Worse last couple of months with associated mid sternal pain described as pinching.  No rest pain.  No presyncope or long runs of palpitations.  Skips a lot not worse with exercise.  Has two glasses of wine most nights.  Thyroid checked recently and ok.    ROS: Denies fever, malais, weight loss, blurry vision, decreased visual acuity, cough, sputum, SOB, hemoptysis, pleuritic pain, palpitaitons, heartburn, abdominal pain, melena, lower extremity edema, claudication, or rash.  All other systems reviewed and negative   General: Affect appropriate Healthy:  appears stated age HEENT: normal Neck supple with no adenopathy JVP normal no bruits no thyromegaly Lungs clear with no wheezing and good diaphragmatic motion Heart:  S1/S2 no murmur,rub, gallop or click PMI normal Abdomen: benighn, BS positve, no tenderness, no AAA no bruit.  No HSM or HJR Distal pulses intact with no bruits No edema Neuro non-focal Skin warm and dry No muscular weakness  Medications Current Outpatient Prescriptions  Medication Sig Dispense Refill  . desoximetasone (TOPICORT) 0.05 % cream Apply topically 2 (two) times daily. For 2 weeks at a time       . fexofenadine (ALLEGRA) 180 MG tablet Take 180 mg by mouth daily.        . metoprolol (LOPRESSOR) 50 MG tablet TAKE 1 TABLET BY MOUTH ONCE DAILY  30 tablet  5    Allergies Escitalopram oxalate  Family History: Family History  Problem Relation Age of Onset  . Alzheimer's disease Mother   . Heart attack Mother     age 4's  . Parkinsonism Father   . Other Sister     Hypoglycemia    Social History: History   Social History  . Marital Status: Married    Spouse  Name: N/A    Number of Children: N/A  . Years of Education: N/A   Occupational History  . Not on file.   Social History Main Topics  . Smoking status: Former Smoker -- 1.5 packs/day for 15 years    Types: Cigarettes    Quit date: 08/12/1995  . Smokeless tobacco: Never Used  . Alcohol Use: 3.0 oz/week    5 Glasses of wine per week  . Drug Use: Not on file  . Sexually Active: Not on file   Other Topics Concern  . Not on file   Social History Narrative   Divorced- Remarried- Husband has a son at [place] child [employer]    Electrocardiogram: 08/10/10 NsR normal ECG  Assessment and Plan

## 2011-02-09 NOTE — Patient Instructions (Signed)
Your physician recommends that you schedule a follow-up appointment in: 6-8 WEEKS  Your physician has recommended that you wear an event monitor. Event monitors are medical devices that record the heart's electrical activity. Doctors most often [place] these monitors to diagnose arrhythmias. Arrhythmias are problems with the speed or rhythm of the heartbeat. The monitor is a small, portable device. You can wear one while you do your normal daily activities. This is usually used to diagnose what is causing palpitations/syncope (passing out).   Your physician has requested that you have a stress echocardiogram. For further information please visit [email or web address] Please follow instruction sheet as given.

## 2011-02-09 NOTE — Assessment & Plan Note (Signed)
Atypical  F/U stress echo   

## 2011-02-09 NOTE — Assessment & Plan Note (Signed)
Nonrecurrent no need for EP referral

## 2011-02-09 NOTE — Assessment & Plan Note (Signed)
Event monitor continue lopresser for now.

## 2011-02-18 ENCOUNTER — Encounter (INDEPENDENT_AMBULATORY_CARE_PROVIDER_SITE_OTHER): Payer: 59

## 2011-02-18 ENCOUNTER — Ambulatory Visit (HOSPITAL_COMMUNITY): Payer: 59 | Attending: Cardiovascular Disease | Admitting: Radiology

## 2011-02-18 ENCOUNTER — Ambulatory Visit (HOSPITAL_BASED_OUTPATIENT_CLINIC_OR_DEPARTMENT_OTHER): Payer: 59 | Admitting: Radiology

## 2011-02-18 DIAGNOSIS — R0609 Other forms of dyspnea: Secondary | ICD-10-CM | POA: Insufficient documentation

## 2011-02-18 DIAGNOSIS — R002 Palpitations: Secondary | ICD-10-CM

## 2011-02-18 DIAGNOSIS — R0989 Other specified symptoms and signs involving the circulatory and respiratory systems: Secondary | ICD-10-CM

## 2011-02-18 DIAGNOSIS — Z8249 Family history of ischemic heart disease and other diseases of the circulatory system: Secondary | ICD-10-CM | POA: Insufficient documentation

## 2011-02-18 DIAGNOSIS — R072 Precordial pain: Secondary | ICD-10-CM | POA: Insufficient documentation

## 2011-02-18 DIAGNOSIS — R5381 Other malaise: Secondary | ICD-10-CM | POA: Insufficient documentation

## 2011-02-22 NOTE — Progress Notes (Signed)
EMG/NCS done 02/15/2011 revealed mild left S1 radiculopathy that is acute.  Waiting for results of MRI l-spine.

## 2011-02-25 ENCOUNTER — Encounter: Payer: Self-pay | Admitting: Neurology

## 2011-02-25 ENCOUNTER — Ambulatory Visit (HOSPITAL_COMMUNITY)
Admission: RE | Admit: 2011-02-25 | Discharge: 2011-02-25 | Disposition: A | Discharge status: Home / Self Care | Payer: 59 | Source: Ambulatory Visit | Attending: Neurology | Admitting: Neurology

## 2011-02-25 DIAGNOSIS — M545 Low back pain: Secondary | ICD-10-CM

## 2011-02-25 DIAGNOSIS — M51379 Other intervertebral disc degeneration, lumbosacral region without mention of lumbar back pain or lower extremity pain: Secondary | ICD-10-CM | POA: Insufficient documentation

## 2011-02-25 DIAGNOSIS — M5137 Other intervertebral disc degeneration, lumbosacral region: Secondary | ICD-10-CM | POA: Insufficient documentation

## 2011-03-02 NOTE — Progress Notes (Signed)
LM on patient's cell phone to call us to schedule her fu appt.

## 2011-03-08 ENCOUNTER — Encounter: Payer: Self-pay | Admitting: Neurology

## 2011-03-16 ENCOUNTER — Encounter: Payer: Self-pay | Admitting: Neurology

## 2011-03-16 NOTE — Progress Notes (Signed)
I was never able to get in touch with Ms. Andrea Ortiz by phone, so I mailed her a letter asking her to call the office and schedule the fu appt.

## 2011-03-22 NOTE — Progress Notes (Signed)
Fu appt sched 04/23/11 at 3:00 pm.

## 2011-04-06 ENCOUNTER — Encounter: Payer: Self-pay | Admitting: *Deleted

## 2011-04-08 ENCOUNTER — Ambulatory Visit: Payer: 59 | Admitting: Cardiovascular Disease

## 2011-04-23 ENCOUNTER — Encounter: Payer: Self-pay | Admitting: Neurology

## 2011-04-23 ENCOUNTER — Ambulatory Visit (INDEPENDENT_AMBULATORY_CARE_PROVIDER_SITE_OTHER): Payer: 59 | Admitting: Neurology

## 2011-04-23 VITALS — BP 136/84 | HR 96 | Wt 140.0 lb

## 2011-04-23 DIAGNOSIS — IMO0002 Reserved for concepts with insufficient information to code with codable children: Secondary | ICD-10-CM

## 2011-04-23 DIAGNOSIS — M5416 Radiculopathy, lumbar region: Secondary | ICD-10-CM

## 2011-04-23 MED ORDER — GABAPENTIN 300 MG PO CAPS: 300.0000 mg | ORAL_CAPSULE | Freq: Three times a day (TID) | ORAL | Status: DC

## 2011-04-23 NOTE — Progress Notes (Signed)
Dear Dr. Fabian Sharp,  I saw  Andrea Ortiz back in [facility] for her problem with numbness, bilateral leg pain and back pain.  As you may recall, she is a 50 y.o. year old female with a history of over 6 months of worsening lower back pain and bilateral numbness and tingling of the legs.  It is worse on the outer left part of the foot.    I got an EMG/NCS of her left lower extremity, which did not reveal any peripheral neuropathy but did reveal a chronic S1 radiculopathy.  I got an MRI of her spine which revealed lumbar facet arthropathy and degenerative disk disease with mild right subarticular lateral recess stensosis at L4-L5.  Right paracentral disk protrusion at L1-L2 that did not compress L2 nerve root.  She says her pain is about the same.  Worse with exercise, but not getting in the way of her function.  Medical history, social history, and family history were reviewed and have not changed since the last clinic visit.  Current Outpatient Prescriptions on File Prior to Visit  Medication Sig Dispense Refill  . desoximetasone (TOPICORT) 0.05 % cream Apply topically 2 (two) times daily. For 2 weeks at a time       . fexofenadine (ALLEGRA) 180 MG tablet Take 180 mg by mouth daily.        . metoprolol (LOPRESSOR) 50 MG tablet TAKE 1 TABLET BY MOUTH ONCE DAILY  30 tablet  5    Allergies  Allergen Reactions  . Escitalopram Oxalate     REACTION: hand s swelled up.    ROS:  13 systems were reviewed and were  unremarkable.  Exam: . Filed Vitals:   04/23/11 1453  BP: 136/84  Pulse: 96  Weight: 140 lb (63.504 kg)    In general, very well appearing women.    Motor:  Normal bulk and tone, no drift and 5/5 muscle strength bilaterally.  Reflexes:  2+ thoughout, toes down, except 1+ left ankle.  Straight leg raise negative bilaterally.  Impression/Recs: ?Bilateral radiculopathies, worse on the left, however, EMG findings of left S1 radic are not supported by imaging  findings.  I explained to her that she may benefit from epidural steroid injection or facet injection.  She would like to try Neurontin for now and I have given her a directions to increase to 300 tid.  She will call me to let me know if this helps.  We can continue to increase it or can try Lyrica or will refer her to interventional pain.   Andrea Ortiz Modesto Charon, MD [facility], [place]

## 2011-04-23 NOTE — Patient Instructions (Signed)
Gabapentin 300mg  caps  Take 1 cap once a day for 1 week,  then 1 cap twice a day for 1 week, then 1 cap three times a day from then on.

## 2012-02-02 MED ORDER — METOPROLOL TARTRATE 50 MG PO TABS: 50.0000 mg | ORAL_TABLET | Freq: Every day | ORAL | Status: DC

## 2012-02-16 ENCOUNTER — Other Ambulatory Visit: Payer: Self-pay | Admitting: Internal Medicine

## 2012-02-16 NOTE — Telephone Encounter (Signed)
Pt needs refill on lopressor call into [facility] [address] Pt has appt on 03-02-2012

## 2012-02-16 NOTE — Telephone Encounter (Signed)
Left message to advise pt that rx was sent to [facility] in [place] on 02/02/12. Advised pt to call back if it cannot be picked up form that pharmacy

## 2012-03-02 ENCOUNTER — Ambulatory Visit: Payer: 59 | Admitting: Internal Medicine

## 2012-03-14 ENCOUNTER — Ambulatory Visit (INDEPENDENT_AMBULATORY_CARE_PROVIDER_SITE_OTHER): Payer: 59 | Admitting: Internal Medicine

## 2012-03-14 ENCOUNTER — Encounter: Payer: Self-pay | Admitting: Internal Medicine

## 2012-03-14 VITALS — BP 128/84 | HR 84 | Temp 98.2°F | Wt 142.0 lb

## 2012-03-14 DIAGNOSIS — D649 Anemia, unspecified: Secondary | ICD-10-CM

## 2012-03-14 DIAGNOSIS — Z23 Encounter for immunization: Secondary | ICD-10-CM

## 2012-03-14 DIAGNOSIS — R202 Paresthesia of skin: Secondary | ICD-10-CM

## 2012-03-14 DIAGNOSIS — R209 Unspecified disturbances of skin sensation: Secondary | ICD-10-CM

## 2012-03-14 DIAGNOSIS — I471 Supraventricular tachycardia: Secondary | ICD-10-CM

## 2012-03-14 LAB — CBC WITH DIFFERENTIAL/PLATELET
Basophils Relative: 0.7 % (ref 0.0–3.0)
Eosinophils Relative: 2 % (ref 0.0–5.0)
Hemoglobin: 11.8 g/dL — ABNORMAL LOW (ref 12.0–15.0)
Lymphocytes Relative: 42.4 % (ref 12.0–46.0)
Monocytes Relative: 10.6 % (ref 3.0–12.0)
Neutro Abs: 1.7 10*3/uL (ref 1.4–7.7)
RBC: 3.57 Mil/uL — ABNORMAL LOW (ref 3.87–5.11)
WBC: 3.8 10*3/uL — ABNORMAL LOW (ref 4.5–10.5)

## 2012-03-14 LAB — IBC PANEL
Saturation Ratios: 32.6 % (ref 20.0–50.0)
Transferrin: 249.7 mg/dL (ref 212.0–360.0)

## 2012-03-14 LAB — FOLATE: Folate: 3.6 ng/mL — ABNORMAL LOW (ref >5.9)

## 2012-03-14 MED ORDER — METOPROLOL TARTRATE 50 MG PO TABS: 25.0000 mg | ORAL_TABLET | Freq: Two times a day (BID) | ORAL | Status: DC

## 2012-03-14 NOTE — Progress Notes (Signed)
Chief Complaint  Patient presents with  . Follow-up    Med    HPI: Pt comes in for med evaluation for metoprolol for her SVT sx.  Since ;last year when she had stress echo and ess nl event monitor she has began using prn med 1/2 pill but mostly for anxiety sx if she feels sx coming on. Feels well exercises without intolerance . No unusual bleeding. Sees gyne no fu lab done  As planned  ROS: See pertinent positives and negatives per HPI. Still has tingling left side but no more pain had se of neurontin  On nothing at present. No weakness or inability to exercise. Feels well.  Today  And no limitations of activity at this time without weakness  Past Medical History  Diagnosis Date  . 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 PSVT   . EXANTHEM   . PALPITATIONS, RECURRENT     Family History  Problem Relation Age of Onset  . Alzheimer's disease Mother   . Heart attack Mother     age 18's  . Parkinsonism Father   . Other Sister     Hypoglycemia    History   Social History  . Marital Status: Married    Spouse Name: N/A    Number of Children: N/A  . Years of Education: N/A   Social History Main Topics  . Smoking status: Former Smoker -- 1.5 packs/day for 15 years    Types: Cigarettes    Quit date: 08/12/1995  . Smokeless tobacco: Never Used  . Alcohol Use: 3.0 oz/week    5 Glasses of wine per week  . Drug Use: None  . Sexually Active: None   Other Topics Concern  . None   Social History Narrative   Divorced- Remarried- Husband has a son at [place] child [employer] [place] [employer]    Outpatient Encounter Prescriptions as of 03/14/2012  Medication Sig Dispense Refill  . desoximetasone (TOPICORT) 0.05 % cream Apply topically 2 (two) times daily. For 2 weeks at a time       . fexofenadine (ALLEGRA) 180 MG tablet Take 180 mg by mouth daily.        . metoprolol (LOPRESSOR) 50 MG tablet Take 0.5 tablets (25 mg total) by mouth 2 (two) times daily. Or as  directed  90 tablet  3  . [DISCONTINUED] metoprolol (LOPRESSOR) 50 MG tablet Take 1 tablet (50 mg total) by mouth daily.  30 tablet  0  . [DISCONTINUED] gabapentin (NEURONTIN) 300 MG capsule Take 1 capsule (300 mg total) by mouth 3 (three) times daily.  90 capsule  2     EXAM:  BP 128/84  Pulse 84  Temp 98.2 F (36.8 C) (Oral)  Wt 142 lb (64.411 kg)  SpO2 98%  GENERAL: vitals reviewed and listed above, alert, oriented, appears well hydrated and in no acute distress  HEENT: atraumatic, conjunctiva  clear, no obvious abnormalities on inspection of external nose and ears OP : no lesion edema or exudate   NECK: no obvious masses on inspection palpation   LUNGS: clear to auscultation bilaterally, no wheezes, rales or rhonchi, good air movement  CV: HRRR, no clubbing cyanosis or  peripheral edema nl cap refill  Abdomen:  Sof,t normal bowel sounds without hepatosplenomegaly, no guarding rebound or masses no CVA tenderness  MS: moves all extremities without noticeable focal   NEURO: oriented x 3 CN 3-12 appear intact. No focal muscle weakness or atrophy. DTRs symmetrical. Gait  WNL.  Grossly non focal. No tremor or abnormal movement. PSYCH: pleasant and cooperative, no obvious depression or anxiety  ASSESSMENT AND PLAN:  Discussed the following assessment and plan:  1. PSVT    using prn med stable lsat assessment fall 12   2. Tingling    left poss radicular no pain now no weakness followup if progressive  see neuro eval.,  3. Anemia    never had fu labs can do today, feels well    flu vaccine today  -Patient advised to return or notify health care team  immediately if symptoms worsen or persist or new concerns arise.  Patient Instructions  Calendar  How your take the medication. Will notify you  of labs when available. Continue lifestyle intervention healthy eating and exercise . Recheck in a year.    Neta Mends. [place] M.D.

## 2012-03-14 NOTE — Patient Instructions (Signed)
Calendar  How your take the medication. Will notify you  of labs when available. Continue lifestyle intervention healthy eating and exercise . Recheck in a year.

## 2012-03-18 ENCOUNTER — Encounter: Payer: Self-pay | Admitting: Internal Medicine

## 2012-03-27 ENCOUNTER — Telehealth: Payer: Self-pay | Admitting: Internal Medicine

## 2012-03-27 NOTE — Telephone Encounter (Signed)
Notes Recorded by Madelin Headings, MD on 03/18/2012 at 11:32 AM Tell pt that she still has slight anemia Her iron seems ok but her folic acid and b12 are low. ? Is she a vegan? Advise Take vitamin b supplements folate and b12 . Every day ( b complex vitamin is ok And should have enough in it.) Recheck B12 folate and cbcdiff in 2-3 months . ( no ov needed if doing ok )   RN gave patient message from Dr Fabian Sharp.  Pt states she is not a vegan: she doesn't eat red meat but she does eat chicken and fish.  Pt verbalized understanding and will begin supplements suggested today (03/27/12)

## 2012-05-09 ENCOUNTER — Ambulatory Visit (INDEPENDENT_AMBULATORY_CARE_PROVIDER_SITE_OTHER)
Admission: RE | Admit: 2012-05-09 | Discharge: 2012-05-09 | Disposition: A | Discharge status: Home / Self Care | Payer: 59 | Source: Ambulatory Visit | Attending: Internal Medicine | Admitting: Internal Medicine

## 2012-05-09 ENCOUNTER — Encounter: Payer: Self-pay | Admitting: Internal Medicine

## 2012-05-09 ENCOUNTER — Ambulatory Visit (INDEPENDENT_AMBULATORY_CARE_PROVIDER_SITE_OTHER): Payer: 59 | Admitting: Internal Medicine

## 2012-05-09 VITALS — BP 142/88 | HR 81 | Temp 98.0°F | Wt 150.0 lb

## 2012-05-09 DIAGNOSIS — R229 Localized swelling, mass and lump, unspecified: Secondary | ICD-10-CM

## 2012-05-09 DIAGNOSIS — R030 Elevated blood-pressure reading, without diagnosis of hypertension: Secondary | ICD-10-CM

## 2012-05-09 DIAGNOSIS — R053 Chronic cough: Secondary | ICD-10-CM | POA: Insufficient documentation

## 2012-05-09 DIAGNOSIS — R059 Cough, unspecified: Secondary | ICD-10-CM

## 2012-05-09 DIAGNOSIS — R222 Localized swelling, mass and lump, trunk: Secondary | ICD-10-CM | POA: Insufficient documentation

## 2012-05-09 DIAGNOSIS — R05 Cough: Secondary | ICD-10-CM

## 2012-05-09 DIAGNOSIS — Z87891 Personal history of nicotine dependence: Secondary | ICD-10-CM

## 2012-05-09 NOTE — Patient Instructions (Addendum)
The lump is not concerning and may be a skin cyst or even a lipoma which is a fatty growth under the skin. If it remains the same and is not growing rapidly and not hurting I would just observe over time. Sometimes things growing they have to be removed but I'm not concerned about serious problem.  It is possible the cough is from a postnasal drainage or even nocturnal reflux to the vocal cords.  Would have you take the Allegra at night instead of the day and see if it makes a difference would also have you take an acid blocker such as omeprazole over-the-counter one a day for the next 2-3 weeks.  If your cough is persistent followup for other advice.   Obtain a chest x-ray in the meantime to make sure your lungs were clear on the x-ray; they are clear today during the exam.  Her blood pressure is up a bit again on repeat is 144/88 checked her readings that time if there continued to be elevated we may need other intervention.  Cough, Adult  A cough is a reflex that helps clear your throat and airways. It can help heal the body or may be a reaction to an irritated airway. A cough may only last 2 or 3 weeks (acute) or may last more than 8 weeks (chronic).  CAUSES Acute cough:  Viral or bacterial infections. Chronic cough:  Infections.  Allergies.  Asthma.  Post-nasal drip.  Smoking.  Heartburn or acid reflux.  Some medicines.  Chronic lung problems (COPD).  Cancer. SYMPTOMS   Cough.  Fever.  Chest pain.  Increased breathing rate.  High-pitched whistling sound when breathing (wheezing).  Colored mucus that you cough up (sputum). TREATMENT   A bacterial cough may be treated with antibiotic medicine.  A viral cough must run its course and will not respond to antibiotics.  Your caregiver may recommend other treatments if you have a chronic cough. HOME CARE INSTRUCTIONS   Only take over-the-counter or prescription medicines for pain, discomfort, or fever as  directed by your caregiver. Use cough suppressants only as directed by your caregiver.  Use a cold steam vaporizer or humidifier in your bedroom or home to help loosen secretions.  Sleep in a semi-upright position if your cough is worse at night.  Rest as needed.  Stop smoking if you smoke. SEEK IMMEDIATE MEDICAL CARE IF:   You have pus in your sputum.  Your cough starts to worsen.  You cannot control your cough with suppressants and are losing sleep.  You begin coughing up blood.  You have difficulty breathing.  You develop pain which is getting worse or is uncontrolled with medicine.  You have a fever. MAKE SURE YOU:   Understand these instructions.  Will watch your condition.  Will get help right away if you are not doing well or get worse. Document Released: 10/16/2010 Document Revised: 07/12/2011 Document Reviewed: 10/16/2010 [facility] Patient Information 2013 [place], [place].

## 2012-05-09 NOTE — Progress Notes (Signed)
Chief Complaint  Patient presents with  . Mass    Has a mass on her left shoulder.  Not painful to touch.  Discovered two weeks ago.  Also has a cough in the mornings that is becoming more persistent.  . Cough    HPI: Patient comes in today  for  new problem evaluation. Onset about 2 weeks ago when she was feeling her upper back of the long finger was and tender but she didn't realize it was fair. Not painful not grown since she discovered it would like it checked out.  Also to address her morning cough that is apparently been going on for quite a while but her husband is not getting more concerned because it is more persistent. She takes Allegra and the symptoms go away in the morning she states that the cough doesn't wake her up at night but she wakes up with lots of drainage and mucus in her throat. Denies chest pain shortness of breath.  She is a next smoker has not smoked for about 15 years.  ROS: See pertinent positives and negatives per HPI.no fever upper respiratory cold symptoms. No elevated blood pressure not checking.  Past Medical History  Diagnosis Date  . 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 PSVT   . EXANTHEM   . PALPITATIONS, RECURRENT   . Chest pain 02/09/2011    Family History  Problem Relation Age of Onset  . Alzheimer's disease Mother   . Heart attack Mother     age 33's  . Parkinsonism Father   . Other Sister     Hypoglycemia    History   Social History  . Marital Status: Married    Spouse Name: N/A    Number of Children: N/A  . Years of Education: N/A   Social History Main Topics  . Smoking status: Former Smoker -- 1.5 packs/day for 15 years    Types: Cigarettes    Quit date: 08/12/1995  . Smokeless tobacco: Never Used  . Alcohol Use: 3.0 oz/week    5 Glasses of wine per week  . Drug Use: None  . Sexually Active: None   Other Topics Concern  . None   Social History Narrative   Divorced- Remarried- Husband has a son at  [place] child [employer]    Outpatient Encounter Prescriptions as of 05/09/2012  Medication Sig Dispense Refill  . desoximetasone (TOPICORT) 0.05 % cream Apply topically 2 (two) times daily. For 2 weeks at a time       . fexofenadine (ALLEGRA) 180 MG tablet Take 180 mg by mouth daily.        . metoprolol (LOPRESSOR) 50 MG tablet Take 0.5 tablets (25 mg total) by mouth 2 (two) times daily. Or as directed  90 tablet  3    EXAM:  BP 142/88  Pulse 81  Temp 98 F (36.7 C) (Oral)  Wt 150 lb (68.04 kg)  SpO2 98%  There is no height on file to calculate BMI.  GENERAL: vitals reviewed and listed above, alert, oriented, appears well hydrated and in no acute distress  HEENT: atraumatic, conjunctiva  clear, no obvious abnormalities on inspection of external nose and ears OP : no lesion edema or exudate  Op clear mild nasal congestion no face pain   NECK: no obvious masses on inspection palpation  No adenopathy  SKIN: about 1.5- 2cm smooth round to ovoid Crimora lump mobile without skin  changes  And no  tenderness  Or redness LUNGS: clear to auscultation bilaterally, no wheezes, rales or rhonchi, good air movement  CV: HRRR, no clubbing cyanosis or  peripheral edema nl cap refill   MS: moves all extremities without noticeable focal  abnormality  PSYCH: pleasant and cooperative, no obvious depression or anxiety  ASSESSMENT AND PLAN:  Discussed the following assessment and plan:  1. Cough, persistent  DG Chest 2 View   poss from drainage poss elr?  ex tobacco get c xray  2. Lump of skin of back     poss cyst vs lipoma  3. Elevated blood pressure reading     recheck and follow up if elevated   4. Former smoker, stopped smoking many years ago      -Patient advised to return or notify health care team  immediately if symptoms worsen or persist or new concerns arise.  Patient Instructions  The lump is not concerning and may be a skin cyst or even a lipoma which is a fatty  growth under the skin. If it remains the same and is not growing rapidly and not hurting I would just observe over time. Sometimes things growing they have to be removed but I'm not concerned about serious problem.  It is possible the cough is from a postnasal drainage or even nocturnal reflux to the vocal cords.  Would have you take the Allegra at night instead of the day and see if it makes a difference would also have you take an acid blocker such as omeprazole over-the-counter one a day for the next 2-3 weeks.  If your cough is persistent followup for other advice.   Obtain a chest x-ray in the meantime to make sure your lungs were clear on the x-ray; they are clear today during the exam.  Her blood pressure is up a bit again on repeat is 144/88 checked her readings that time if there continued to be elevated we may need other intervention.  Cough, Adult  A cough is a reflex that helps clear your throat and airways. It can help heal the body or may be a reaction to an irritated airway. A cough may only last 2 or 3 weeks (acute) or may last more than 8 weeks (chronic).  CAUSES Acute cough:  Viral or bacterial infections. Chronic cough:  Infections.  Allergies.  Asthma.  Post-nasal drip.  Smoking.  Heartburn or acid reflux.  Some medicines.  Chronic lung problems (COPD).  Cancer. SYMPTOMS   Cough.  Fever.  Chest pain.  Increased breathing rate.  High-pitched whistling sound when breathing (wheezing).  Colored mucus that you cough up (sputum). TREATMENT   A bacterial cough may be treated with antibiotic medicine.  A viral cough must run its course and will not respond to antibiotics.  Your caregiver may recommend other treatments if you have a chronic cough. HOME CARE INSTRUCTIONS   Only take over-the-counter or prescription medicines for pain, discomfort, or fever as directed by your caregiver. Use cough suppressants only as directed by your  caregiver.  Use a cold steam vaporizer or humidifier in your bedroom or home to help loosen secretions.  Sleep in a semi-upright position if your cough is worse at night.  Rest as needed.  Stop smoking if you smoke. SEEK IMMEDIATE MEDICAL CARE IF:   You have pus in your sputum.  Your cough starts to worsen.  You cannot control your cough with suppressants and are losing sleep.  You begin coughing up blood.  You have  difficulty breathing.  You develop pain which is getting worse or is uncontrolled with medicine.  You have a fever. MAKE SURE YOU:   Understand these instructions.  Will watch your condition.  Will get help right away if you are not doing well or get worse. Document Released: 10/16/2010 Document Revised: 07/12/2011 Document Reviewed: 10/16/2010 [facility] Patient Information 2013 [place], [place].    Neta Mends. Andreyah Natividad M.D.

## 2013-01-23 ENCOUNTER — Ambulatory Visit (INDEPENDENT_AMBULATORY_CARE_PROVIDER_SITE_OTHER): Payer: 59

## 2013-01-23 DIAGNOSIS — Z23 Encounter for immunization: Secondary | ICD-10-CM

## 2013-03-23 ENCOUNTER — Other Ambulatory Visit: Payer: Self-pay | Admitting: Internal Medicine

## 2013-03-26 ENCOUNTER — Other Ambulatory Visit: Payer: Self-pay | Admitting: Internal Medicine

## 2013-04-04 ENCOUNTER — Other Ambulatory Visit: Payer: Self-pay | Admitting: Family Medicine

## 2013-04-04 ENCOUNTER — Telehealth: Payer: Self-pay | Admitting: Internal Medicine

## 2013-04-04 MED ORDER — METOPROLOL TARTRATE 50 MG PO TABS: ORAL_TABLET | ORAL | Status: DC

## 2013-04-04 NOTE — Telephone Encounter (Signed)
Sent to the pharmacy by e-scribe. 

## 2013-04-04 NOTE — Telephone Encounter (Signed)
Pt needs refill on metoprolol sent to [facility]. Pt has cpx sch for 06-13-2013

## 2013-06-06 ENCOUNTER — Other Ambulatory Visit: Payer: 59

## 2013-06-07 ENCOUNTER — Other Ambulatory Visit (INDEPENDENT_AMBULATORY_CARE_PROVIDER_SITE_OTHER): Payer: 59

## 2013-06-07 DIAGNOSIS — Z0000 Encounter for general adult medical examination without abnormal findings: Secondary | ICD-10-CM

## 2013-06-07 LAB — CBC WITH DIFFERENTIAL/PLATELET
BASOS ABS: 0 10*3/uL (ref 0.0–0.1)
Basophils Relative: 0.7 % (ref 0.0–3.0)
EOS PCT: 2 % (ref 0.0–5.0)
Eosinophils Absolute: 0.1 10*3/uL (ref 0.0–0.7)
HEMATOCRIT: 35.5 % — AB (ref 36.0–46.0)
Hemoglobin: 11.9 g/dL — ABNORMAL LOW (ref 12.0–15.0)
LYMPHS PCT: 37.2 % (ref 12.0–46.0)
Lymphs Abs: 1.4 10*3/uL (ref 0.7–4.0)
MCHC: 33.6 g/dL (ref 30.0–36.0)
MCV: 101.2 fl — ABNORMAL HIGH (ref 78.0–100.0)
MONOS PCT: 13.1 % — AB (ref 3.0–12.0)
Monocytes Absolute: 0.5 10*3/uL (ref 0.1–1.0)
NEUTROS PCT: 47 % (ref 43.0–77.0)
Neutro Abs: 1.7 10*3/uL (ref 1.4–7.7)
PLATELETS: 274 10*3/uL (ref 150.0–400.0)
RBC: 3.5 Mil/uL — ABNORMAL LOW (ref 3.87–5.11)
RDW: 12.8 % (ref 11.5–14.6)
WBC: 3.7 10*3/uL — AB (ref 4.5–10.5)

## 2013-06-07 LAB — LDL CHOLESTEROL, DIRECT: Direct LDL: 155.4 mg/dL

## 2013-06-07 LAB — LIPID PANEL
CHOL/HDL RATIO: 2
Cholesterol: 268 mg/dL — ABNORMAL HIGH (ref 0–200)
HDL: 107.3 mg/dL (ref >39.00)
Triglycerides: 54 mg/dL (ref 0.0–149.0)
VLDL: 10.8 mg/dL (ref 0.0–40.0)

## 2013-06-07 LAB — HEPATIC FUNCTION PANEL
ALK PHOS: 28 U/L — AB (ref 39–117)
ALT: 34 U/L (ref 0–35)
AST: 41 U/L — AB (ref 0–37)
Albumin: 4.5 g/dL (ref 3.5–5.2)
BILIRUBIN DIRECT: 0 mg/dL (ref 0.0–0.3)
BILIRUBIN TOTAL: 0.9 mg/dL (ref 0.3–1.2)
Total Protein: 7.5 g/dL (ref 6.0–8.3)

## 2013-06-07 LAB — BASIC METABOLIC PANEL
BUN: 17 mg/dL (ref 6–23)
CALCIUM: 9.9 mg/dL (ref 8.4–10.5)
CO2: 28 mEq/L (ref 19–32)
CREATININE: 0.7 mg/dL (ref 0.4–1.2)
Chloride: 103 mEq/L (ref 96–112)
GFR: 94.69 mL/min (ref >60.00)
GLUCOSE: 89 mg/dL (ref 70–99)
POTASSIUM: 4.8 meq/L (ref 3.5–5.1)
Sodium: 139 mEq/L (ref 135–145)

## 2013-06-07 LAB — TSH: TSH: 1.63 u[IU]/mL (ref 0.35–5.50)

## 2013-06-13 ENCOUNTER — Ambulatory Visit (INDEPENDENT_AMBULATORY_CARE_PROVIDER_SITE_OTHER): Payer: 59 | Admitting: Internal Medicine

## 2013-06-13 ENCOUNTER — Encounter: Payer: Self-pay | Admitting: Internal Medicine

## 2013-06-13 VITALS — BP 116/80 | HR 77 | Temp 97.7°F | Ht 66.0 in | Wt 146.0 lb

## 2013-06-13 DIAGNOSIS — E785 Hyperlipidemia, unspecified: Secondary | ICD-10-CM

## 2013-06-13 DIAGNOSIS — Z0000 Encounter for general adult medical examination without abnormal findings: Secondary | ICD-10-CM

## 2013-06-13 DIAGNOSIS — M549 Dorsalgia, unspecified: Secondary | ICD-10-CM

## 2013-06-13 DIAGNOSIS — R002 Palpitations: Secondary | ICD-10-CM

## 2013-06-13 DIAGNOSIS — D649 Anemia, unspecified: Secondary | ICD-10-CM

## 2013-06-13 LAB — FOLATE: Folate: 5.6 ng/mL — ABNORMAL LOW (ref >5.9)

## 2013-06-13 LAB — VITAMIN B12: Vitamin B-12: 382 pg/mL (ref 211–911)

## 2013-06-13 LAB — FERRITIN: FERRITIN: 86.2 ng/mL (ref 10.0–291.0)

## 2013-06-13 LAB — IBC PANEL
IRON: 103 ug/dL (ref 42–145)
SATURATION RATIOS: 32.7 % (ref 20.0–50.0)
TRANSFERRIN: 224.7 mg/dL (ref 212.0–360.0)

## 2013-06-13 NOTE — Progress Notes (Signed)
Chief Complaint  Patient presents with  . Annual Exam    back pain    HPI: Patient comes in today for Preventive Health Care visit  No major change in health status since last visit . Has gyne  Check and ok  Pap and mammo June  Now on buspar using prn anxiety and seem to help. Palpitations:  2 x per week   Lopressor 2 x per week  Stress.  And caffiene are triggers  Able to exercise and no change in resp sx  No periods for 2 years .  No bleeding blood in stool  Taking a good bit of nsaids for back has lower lumbar back and lower thorac pain at times  Then ocass shooting pains around thorax  Scared her  No assoc [place] otherwise   ROS:  GEN/ HEENT: No fever, significant weight changes sweats headaches vision problems hearing changes, CV/ PULM; No chest pain shortness of breath cough, syncope,edema  change in exercise tolerance. GI /GU: No adominal pain, vomiting, change in bowel habits. No blood in the stool. No significant GU symptoms. SKIN/HEME: ,no acute skin rashes suspicious lesions or bleeding. No lymphadenopathy, nodules, masses.  NEURO/ PSYCH:  No neurologic signs such as weakness numbness. No depression anxiety. IMM/ Allergy: No unusual infections.  Allergy .   REST of 12 system review negative except as per HPI   Past Medical History  Diagnosis Date  . 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 PSVT   . EXANTHEM   . PALPITATIONS, RECURRENT   . Chest pain 02/09/2011    Family History  Problem Relation Age of Onset  . Alzheimer's disease Mother   . Heart attack Mother     age 47's  . Parkinsonism Father   . Other Sister     Hypoglycemia    History   Social History  . Marital Status: Married    Spouse Name: N/A    Number of Children: N/A  . Years of Education: N/A   Social History Main Topics  . Smoking status: Former Smoker -- 1.50 packs/day for 15 years    Types: Cigarettes    Quit date: 08/12/1995  . Smokeless tobacco: Never Used  . Alcohol  Use: 3.0 oz/week    5 Glasses of wine per week  . Drug Use: None  . Sexual Activity: None   Other Topics Concern  . None   Social History Narrative   Divorced- Remarried- Husband has a son at [place]   One child    Dog   [employer]      hhof 4  No ets.    Avoids caffiene etoh with dinner.   Exercise 5 d  Per week.    Sleep 6 hours or less     Outpatient Encounter Prescriptions as of 06/13/2013  Medication Sig  . busPIRone (BUSPAR) 10 MG tablet Take 10 mg by mouth 3 (three) times daily. Take 1 tablet by mouth every night at bedtime as needed  . desoximetasone (TOPICORT) 0.05 % cream Apply topically 2 (two) times daily. For 2 weeks at a time   . fexofenadine (ALLEGRA) 180 MG tablet Take 180 mg by mouth daily.    . metoprolol (LOPRESSOR) 50 MG tablet TAKE 1/2 TABLET BY MOUTH TWICE DAILY OR AS DIRECTED.    EXAM:  BP 116/80  Pulse 77  Temp(Src) 97.7 F (36.5 C) (Oral)  Ht 5\' 6"  (1.676 m)  Wt 146 lb (66.225 kg)  BMI 23.58 kg/m2  SpO2 98%  Body mass index is 23.58 kg/(m^2).  Physical Exam: Vital signs reviewed ZOX:WRUE is a well-developed well-nourished alert cooperative   female who appears her stated age in no acute distress.  HEENT: normocephalic atraumatic , Eyes: PERRL EOM's full, conjunctiva clear, Nares: paten,t no deformity discharge or tenderness., Ears: no deformity EAC's clear TMs with normal landmarks. Mouth: clear OP, no lesions, edema.  Moist mucous membranes. Dentition in adequate repair. NECK: supple without masses, thyromegaly or bruits. CHEST/PULM:  Clear to auscultation and percussion breath sounds equal no wheeze , rales or rhonchi. No chest wall deformities or tenderness. CV: PMI is nondisplaced, S1 S2 no gallops, murmurs, rubs. Peripheral pulses are full without delay.No JVD .  ABDOMEN: Bowel sounds normal nontender  No guard or rebound, no hepato splenomegal no CVA tenderness.  No hernia. Extremtities:  No clubbing cyanosis or edema, no  acute joint swelling or redness no focal atrophy NEURO:  Oriented x3, cranial nerves 3-12 appear to be intact, no obvious focal weakness,gait within normal limits no abnormal reflexes or asymmetrical SKIN: No acute rashes normal turgor, color, no bruising or petechiae. PSYCH: Oriented, good eye contact, no obvious depression anxiety, cognition and judgment appear normal. LN: no cervical axillary inguinal adenopathy  Lab Results  Component Value Date   WBC 3.7* 06/07/2013   HGB 11.9* 06/07/2013   HCT 35.5* 06/07/2013   PLT 274.0 06/07/2013   GLUCOSE 89 06/07/2013   CHOL 268* 06/07/2013   TRIG 54.0 06/07/2013   HDL 107.30 06/07/2013   LDLDIRECT 155.4 06/07/2013   ALT 34 06/07/2013   AST 41* 06/07/2013   NA 139 06/07/2013   K 4.8 06/07/2013   CL 103 06/07/2013   CREATININE 0.7 06/07/2013   BUN 17 06/07/2013   CO2 28 06/07/2013   TSH 1.63 06/07/2013    ASSESSMENT AND PLAN:  Discussed the following assessment and plan:  Visit for preventive health examination - to get colonoscopy  Other and unspecified hyperlipidemia  Anemia - mild with elevated mcv mod etoh limit red meat otherwise not a vegan  - Plan: IBC panel, Ferritin, Vitamin B12, Folate  Spine pain, multilevel - lower thorax and upper lumbar with shoooting pains trunk x ray and follow if progressive or abnormal  - Plan: DG Lumbar Spine Complete, DG Thoracolumbar Spine  PALPITATIONS, RECURRENT - hx of svt taking med prn about 2 x per week Review shows that b12 somewhat low last year   See lab notes  Never had fu  Repeat studies for plan  Minor abn on lfts poss from her nsaid  Patient Care Team: Burnis Medin, MD as PCP - General Josue Hector, MD as Attending Physician (Cardiology) Gus Height, MD as Consulting Physician (Obstetrics and Gynecology) Patient Instructions  Get your colonoscopy  .  Uncertain cause  Of the anemia    Although chould check irona dn B12 levels and get  Your colonoscopy. Also check plain spine x ray  The sx seem like  arthritis or pinched nerer in spine would not change activity level .  Fu if  persistent or progressive .  Continue lifestyle intervention healthy eating and exercise .   Standley Brooking. Taylia Berber M.D.   Pre visit review using our clinic review tool, if applicable. No additional management support is needed unless otherwise documented below in the visit note.

## 2013-06-13 NOTE — Patient Instructions (Addendum)
Get your colonoscopy  .  Uncertain cause  Of the anemia    Although chould check irona dn B12 levels and get  Your colonoscopy. Also check plain spine x ray  The sx seem like arthritis or pinched nerer in spine would not change activity level .  Fu if  persistent or progressive .  Continue lifestyle intervention healthy eating and exercise .

## 2013-06-18 ENCOUNTER — Ambulatory Visit (INDEPENDENT_AMBULATORY_CARE_PROVIDER_SITE_OTHER)
Admission: RE | Admit: 2013-06-18 | Discharge: 2013-06-18 | Disposition: A | Discharge status: Home / Self Care | Payer: 59 | Source: Ambulatory Visit | Attending: Internal Medicine | Admitting: Internal Medicine

## 2013-06-18 ENCOUNTER — Other Ambulatory Visit: Payer: Self-pay | Admitting: Internal Medicine

## 2013-06-18 DIAGNOSIS — M549 Dorsalgia, unspecified: Secondary | ICD-10-CM

## 2013-06-22 ENCOUNTER — Other Ambulatory Visit: Payer: Self-pay | Admitting: Family Medicine

## 2013-06-25 ENCOUNTER — Other Ambulatory Visit: Payer: Self-pay | Admitting: Family Medicine

## 2013-06-25 DIAGNOSIS — M419 Scoliosis, unspecified: Secondary | ICD-10-CM

## 2013-06-25 DIAGNOSIS — M549 Dorsalgia, unspecified: Secondary | ICD-10-CM

## 2013-07-18 ENCOUNTER — Ambulatory Visit (INDEPENDENT_AMBULATORY_CARE_PROVIDER_SITE_OTHER): Payer: 59 | Admitting: Family Medicine

## 2013-07-18 ENCOUNTER — Encounter: Payer: Self-pay | Admitting: Family Medicine

## 2013-07-18 VITALS — BP 110/76 | HR 77 | Wt 144.0 lb

## 2013-07-18 DIAGNOSIS — M999 Biomechanical lesion, unspecified: Secondary | ICD-10-CM

## 2013-07-18 DIAGNOSIS — M2170 Unequal limb length (acquired), unspecified site: Secondary | ICD-10-CM

## 2013-07-18 DIAGNOSIS — M533 Sacrococcygeal disorders, not elsewhere classified: Secondary | ICD-10-CM

## 2013-07-18 MED ORDER — MELOXICAM 15 MG PO TABS: 15.0000 mg | ORAL_TABLET | Freq: Every day | ORAL | Status: DC

## 2013-07-18 NOTE — Assessment & Plan Note (Signed)
Exam and history consistent with sciatica tender to sacroiliac dysfunction the Anatomy reviewed using [facility] text  Start ROM protocols, direct massage of piriformis, HIP ROM, core stabily, pelvic stability Eloxatin

## 2013-07-18 NOTE — Assessment & Plan Note (Signed)
Decision today to treat with OMT was based on Physical Exam  After verbal consent patient was treated with HVLA and muscle energy techniques in thoracic, lumbar, sacral and ilium areas  Patient tolerated the procedure well with improvement in symptoms  Patient given exercises, stretches and lifestyle modifications  See medications in patient instructions if given  Patient will follow up in 3 weeks         

## 2013-07-18 NOTE — Assessment & Plan Note (Signed)
Decision today to treat with OMT was based on Physical Exam  After verbal consent patient was treated with HVLA and muscle energy techniques in thoracic, lumbar, sacral and ilium areas  Patient tolerated the procedure well with improvement in symptoms  Patient given exercises, stretches and lifestyle modifications  See medications in patient instructions if given  Patient will follow up in 3 weeks

## 2013-07-18 NOTE — Progress Notes (Signed)
  [name] [place],  [address] Phone: [phone] Subjective:    I'm seeing this patient by the request  of:  Lottie Dawson, MD   CC: Back pain  [id number] Andrea Ortiz is a 53 y.o. female coming in with complaint of back pain. Patient has had a history of back pain for quite some time. Patient actually had low back pain back in 2012 an MRI was ordered. MRI was reviewed by me today. Patient at that time did have lumbar facet arthropathy with degenerative disc disease mostly at the L4-L5 area. Patient also had new x-rays which were also reviewed by me and. February 23rd 2015 lumbar and thoracic x-rays were taken and did show multilevel degenerative changes mostly of the facet arthropathy. Patient states her back pain seems to be worse after activity. Patient states that it seems to be very localized onto the left side and can have some radiation going on the posterior portion of the leg all the way down to her knee. Patient states this is very seldomly when it happens it is very concerning. Patient denies any numbness or weakness. Patient states that she does have numbness after walking a long distance in both feet but once again she walks barefoot. Denies any swelling of the lower extremities. Patient denies any fevers or chills or any abnormal weight loss.    Past medical history, social, surgical and family history all reviewed in electronic medical record.   Review of Systems: No headache, visual changes, nausea, vomiting, diarrhea, constipation, dizziness, abdominal pain, skin rash, fevers, chills, night sweats, weight loss, swollen lymph nodes, body aches, joint swelling, muscle aches, chest pain, shortness of breath, mood changes.   Objective Blood pressure 110/76, pulse 77, weight 144 lb (65.318 kg), SpO2 94.00%.  General: No apparent distress alert and oriented x3 mood and affect normal, dressed appropriately.  HEENT:  Pupils equal, extraocular movements intact  Respiratory: Patient's speak in full sentences and does not appear short of breath  Cardiovascular: No lower extremity edema, non tender, no erythema  Skin: Warm dry intact with no signs of infection or rash on extremities or on axial skeleton.  Abdomen: Soft nontender  Neuro: Cranial nerves II through XII are intact, neurovascularly intact in all extremities with 2+ DTRs and 2+ pulses.  Lymph: No lymphadenopathy of posterior or anterior cervical chain or axillae bilaterally.  Gait normal with good balance and coordination.  MSK:  Non tender with full range of motion and good stability and symmetric strength and tone of shoulders, elbows, wrist, hip, knee and ankles bilaterally.  Back Exam:  Inspection: Mild dextroscoliosis of the lumbar spine Motion: Flexion 45 deg, Extension 45 deg, Side Bending to 45 deg bilaterally,  Rotation to 45 deg bilaterally  SLR laying: Negative  XSLR laying: Negative  Palpable tenderness: Severe tenderness over the left SI joint FABER: Positive on left Sensory change: Gross sensation intact to all lumbar and sacral dermatomes.  Reflexes: 2+ at both patellar tendons, 2+ at achilles tendons, Babinski's downgoing.  Strength at foot  Plantar-flexion: 5/5 Dorsi-flexion: 5/5 Eversion: 5/5 Inversion: 5/5  Leg strength  Quad: 5/5 Hamstring: 5/5 Hip flexor: 5/5 Hip abductors: 4/5  Gait unremarkable. Patient does have a 1/8 of an inch leg length discrepancy on the right   Impression and Recommendations:     This case required medical decision making of moderate complexity.

## 2013-07-18 NOTE — Assessment & Plan Note (Signed)
Patient was given a heel lift in the right shoe. She should wear this at all times and we discussed working out with shoes on.

## 2013-07-18 NOTE — Patient Instructions (Addendum)
Very nice to meet you Heel lift in right shoe Wear shoes with exercise.  Please try exercises 3-5 times a week.  Sacroiliac Joint Mobilization and Rehab 1. Work on pretzel stretching, shoulder back and leg draped in front. 3-5 sets, 30 sec.. 2. hip abductor rotations. standing, hip flexion and rotation outward then inward. 3 sets, 15 reps. when can do comfortably, add ankle weights starting at 2 pounds.  3. cross over stretching - shoulder back to ground, same side leg crossover. 3-5 sets for 30 min..  4. rolling up and back knees to chest and rocking. 5. sacral tilt - 5 sets, hold for 5-10 seconds  Heat before activity and ice afterward.  Short course of meloxicam daily for 10 days then as needed.  Take tylenol 650 mg three times a day is the best evidence based medicine we have for arthritis.  Glucosamine sulfate 750mg  twice a day is a supplement that has been shown to help moderate to severe arthritis. Vitamin D 2000 IU daily Fish oil 2 grams daily.  Tumeric 500mg  twice daily.  Capsaicin topically up to four times a day may also help with pain. It's important that you continue to stay active. Controlling your weight is important.  Consider physical therapy to strengthen muscles around the joint that hurts to take pressure off of the joint itself. Shoe inserts with good arch support may be helpful.  [employer] [facility] at [employer] [facility] could help.  Come back and see me in 3 weeks.

## 2013-08-08 ENCOUNTER — Encounter: Payer: Self-pay | Admitting: Family Medicine

## 2013-08-08 ENCOUNTER — Ambulatory Visit (INDEPENDENT_AMBULATORY_CARE_PROVIDER_SITE_OTHER): Payer: 59 | Admitting: Family Medicine

## 2013-08-08 VITALS — BP 92/66 | HR 74

## 2013-08-08 DIAGNOSIS — M999 Biomechanical lesion, unspecified: Secondary | ICD-10-CM

## 2013-08-08 DIAGNOSIS — M533 Sacrococcygeal disorders, not elsewhere classified: Secondary | ICD-10-CM

## 2013-08-08 NOTE — Progress Notes (Signed)
[name] [place], [place] [address] Phone: [phone] Subjective:    I'm seeing this patient by the request  of:  Lottie Dawson, MD   CC: Back pain  [id number] Andrea Ortiz is a 53 y.o. female coming in with complaint of back pain. Patient does have chronic back pain as well as sacroiliac joint dysfunction. Patient was given home exercises, heel lift, over-the-counter medications we discussed as well as anti-inflammatories. Patient states she is doing remarkably better. Patient has not taken meloxicam anymore a regular basis and states that the over-the-counter medications as helped significantly. Patient denies any significant back pain at this time and has been doing more exercises and has been feeling better. Overall patient is very happy with the results and denies any new symptoms.  PMHX: Patient has had a history of back pain for quite some time. Patient actually had low back pain back in 2012 an MRI was ordered. MRI was reviewed by me today. Patient at that time did have lumbar facet arthropathy with degenerative disc disease mostly at the L4-L5 area. Patient also had new x-rays which were also reviewed by me and. February 23rd 2015 lumbar and thoracic x-rays were taken and did show multilevel degenerative changes mostly of the facet arthropathy.   Past medical history, social, surgical and family history all reviewed in electronic medical record.   Review of Systems: No headache, visual changes, nausea, vomiting, diarrhea, constipation, dizziness, abdominal pain, skin rash, fevers, chills, night sweats, weight loss, swollen lymph nodes, body aches, joint swelling, muscle aches, chest pain, shortness of breath, mood changes.   Objective Blood pressure 92/66, pulse 74, SpO2 97.00%.  General: No apparent distress alert and oriented x3 mood and affect normal, dressed appropriately.  HEENT: Pupils equal, extraocular movements  intact  Respiratory: Patient's speak in full sentences and does not appear short of breath  Cardiovascular: No lower extremity edema, non tender, no erythema  Skin: Warm dry intact with no signs of infection or rash on extremities or on axial skeleton.  Abdomen: Soft nontender  Neuro: Cranial nerves II through XII are intact, neurovascularly intact in all extremities with 2+ DTRs and 2+ pulses.  Lymph: No lymphadenopathy of posterior or anterior cervical chain or axillae bilaterally.  Gait normal with good balance and coordination.  MSK:  Non tender with full range of motion and good stability and symmetric strength and tone of shoulders, elbows, wrist, hip, knee and ankles bilaterally.  Back Exam:  Inspection: Mild dextroscoliosis of the lumbar spine Motion: Flexion 45 deg, Extension 45 deg, Side Bending to 45 deg bilaterally,  Rotation to 45 deg bilaterally  SLR laying: Negative  XSLR laying: Negative  Palpable tenderness: Severe tenderness over the left SI joint FABER: Positive on left Sensory change: Gross sensation intact to all lumbar and sacral dermatomes.  Reflexes: 2+ at both patellar tendons, 2+ at achilles tendons, Babinski's downgoing.  Strength at foot  Plantar-flexion: 5/5 Dorsi-flexion: 5/5 Eversion: 5/5 Inversion: 5/5  Leg strength  Quad: 5/5 Hamstring: 5/5 Hip flexor: 5/5 Hip abductors: 4/5  Gait unremarkable. Patient does have a 1/8 of an inch leg length discrepancy on the right  OMT Physical Exam  Cervical  C2 flexed rotated and side bent left C7 flexed rotated and side bent right  Thoracic T5 extended rotated inside that right  Lumbar L2 flexed rotated inside that right  Sacrum Left on left      Impression and Recommendations:  This case required medical decision making of moderate complexity.

## 2013-08-08 NOTE — Patient Instructions (Signed)
Good to see you Exercises 3 times a week.  Keep it up! Continue the turmeric See me in 6 weeks for another round.

## 2013-08-08 NOTE — Assessment & Plan Note (Signed)
Decision today to treat with OMT was based on Physical Exam  After verbal consent patient was treated with HVLA and muscle energy techniques in cervical thoracic, lumbar, sacral and ilium areas  Patient tolerated the procedure well with improvement in symptoms  Patient given exercises, stretches and lifestyle modifications  See medications in patient instructions if given  Patient will follow up in 6 weeks               

## 2013-08-08 NOTE — Assessment & Plan Note (Signed)
Patient is doing remarkably better and has had improvement core strength and has considerably less pain. Patient encouraged to continue the exercises 2-3 times a week. Continue the over-the-counter medications. Patient can come back again and probably 6 weeks. At that time we'll do another evaluation and can do more manipulation if necessary.

## 2013-09-19 ENCOUNTER — Ambulatory Visit (INDEPENDENT_AMBULATORY_CARE_PROVIDER_SITE_OTHER): Payer: 59 | Admitting: Family Medicine

## 2013-09-19 ENCOUNTER — Encounter: Payer: Self-pay | Admitting: Family Medicine

## 2013-09-19 VITALS — BP 94/72 | HR 85 | Ht 66.0 in | Wt 143.0 lb

## 2013-09-19 DIAGNOSIS — M533 Sacrococcygeal disorders, not elsewhere classified: Secondary | ICD-10-CM

## 2013-09-19 DIAGNOSIS — M999 Biomechanical lesion, unspecified: Secondary | ICD-10-CM

## 2013-09-19 NOTE — Patient Instructions (Signed)
Great to see you as always.  Continue the exercises 2 times a week.  Pilates is great consider yoga slowly.  Come back in 6 weeks.

## 2013-09-19 NOTE — Assessment & Plan Note (Signed)
Decision today to treat with OMT was based on Physical Exam  After verbal consent patient was treated with HVLA and muscle energy techniques in cervical thoracic, lumbar, sacral and ilium areas  Patient tolerated the procedure well with improvement in symptoms  Patient given exercises, stretches and lifestyle modifications  See medications in patient instructions if given  Patient will follow up in 6-8 weeks

## 2013-09-19 NOTE — Progress Notes (Signed)
  [facility] [address] [place], [place] [address] Phone: [phone] Subjective:     CC: Back pain follow up  [id number] Gayatri DYLANA SHAW is a 53 y.o. female coming in with complaint of back pain. Patient does have chronic back pain as well as sacroiliac joint dysfunction. Patient has been doing conservative therapy and has responded well to manipulation therapy. Patient states she is doing extremely well but then she tried to start to go which is causing him more discomfort than usual. Patient says most the pain is in the lumbar spine. Denies any radiation down the legs any numbness. Patient still able to do all 3 today living but just notices some more soreness and tightness. Still able to sleep comfortably at night.  PMHX: MRI was ordered. MRI was reviewed by me today. Patient at that time did have lumbar facet arthropathy with degenerative disc disease mostly at the L4-L5 area. Patient also had new x-rays which were also reviewed by me and. February 23rd 2015 lumbar and thoracic x-rays were taken and did show multilevel degenerative changes mostly of the facet arthropathy.   Past medical history, social, surgical and family history all reviewed in electronic medical record.   Review of Systems: No headache, visual changes, nausea, vomiting, diarrhea, constipation, dizziness, abdominal pain, skin rash, fevers, chills, night sweats, weight loss, swollen lymph nodes, body aches, joint swelling, muscle aches, chest pain, shortness of breath, mood changes.   Objective Blood pressure 94/72, pulse 85, height 5\' 6"  (1.676 m), weight 143 lb (64.864 kg), SpO2 98.00%.  General: No apparent distress alert and oriented x3 mood and affect normal, dressed appropriately.  HEENT: Pupils equal, extraocular movements intact  Respiratory: Patient's speak in full sentences and does not appear short of breath  Cardiovascular: No lower extremity edema, non tender, no  erythema  Skin: Warm dry intact with no signs of infection or rash on extremities or on axial skeleton.  Abdomen: Soft nontender  Neuro: Cranial nerves II through XII are intact, neurovascularly intact in all extremities with 2+ DTRs and 2+ pulses.  Lymph: No lymphadenopathy of posterior or anterior cervical chain or axillae bilaterally.  Gait normal with good balance and coordination.  MSK:  Non tender with full range of motion and good stability and symmetric strength and tone of shoulders, elbows, wrist, hip, knee and ankles bilaterally.  Back Exam:  Inspection: Mild dextroscoliosis of the lumbar spine Motion: Flexion 45 deg, Extension 45 deg, Side Bending to 45 deg bilaterally,  Rotation to 45 deg bilaterally  SLR laying: Negative  XSLR laying: Negative  Palpable tenderness: Severe tenderness over the left SI joint FABER: Positive on left Sensory change: Gross sensation intact to all lumbar and sacral dermatomes.  Reflexes: 2+ at both patellar tendons, 2+ at achilles tendons, Babinski's downgoing.  Strength at foot  Plantar-flexion: 5/5 Dorsi-flexion: 5/5 Eversion: 5/5 Inversion: 5/5  Leg strength  Quad: 5/5 Hamstring: 5/5 Hip flexor: 5/5 Hip abductors: 4/5  Gait unremarkable.   OMT Physical Exam  Cervical  C2 flexed rotated and side bent left C7 flexed rotated and side bent right  Thoracic T5 extended rotated inside that right  Lumbar L2 flexed rotated inside that right  Sacrum Left on left      Impression and Recommendations:     This case required medical decision making of moderate complexity.

## 2013-09-19 NOTE — Assessment & Plan Note (Signed)
Patient continues to do fairly well with conservative therapy. Patient given other exercises for more core strengthening that could be beneficial. Patient showed proper technique. We also discussed proper shoe choices that could be more beneficial. Patient will continue the other interventions which we discussed previously. Patient and will come back again in 6-8 weeks for further evaluation and treatment.  Spent greater than 25 minutes with patient face-to-face and had greater than 50% of counseling including as described above in assessment and plan.

## 2013-10-31 ENCOUNTER — Ambulatory Visit: Payer: 59 | Admitting: Family Medicine

## 2013-10-31 ENCOUNTER — Telehealth: Payer: Self-pay | Admitting: Family Medicine

## 2013-10-31 DIAGNOSIS — Z0289 Encounter for other administrative examinations: Secondary | ICD-10-CM

## 2013-10-31 NOTE — Telephone Encounter (Signed)
Your pt did not show up for their appt today. Please advise.

## 2013-11-01 NOTE — Telephone Encounter (Signed)
No show is fine.

## 2013-11-20 ENCOUNTER — Ambulatory Visit (HOSPITAL_COMMUNITY)
Admission: RE | Admit: 2013-11-20 | Discharge: 2013-11-20 | Disposition: A | Discharge status: Home / Self Care | Payer: 59 | Attending: Psychiatry | Admitting: Psychiatry

## 2013-11-20 NOTE — Progress Notes (Signed)
Pt refused MSE. This NP explained MSE to pt in front of family and family further explained to pt that pt had just consented to have an MSE and requested it. Pt looked alarmed and adamantly refused, stating "I don't need that at all". Pt insisted upon leaving immediately.  Benjamine Mola, FNP-BC
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
## 2013-11-20 NOTE — BH Assessment (Signed)
Consulted with Andrea Ortiz and Dr.Gerald Lovena Le who are in agreement that patient does not meet inpatient criteria at this time. No current SI,HI, and no AVH reported. Pt is able to contract for safety.   Shaune Pollack, MS, [place] Assessment Counselor

## 2013-12-25 ENCOUNTER — Ambulatory Visit (HOSPITAL_COMMUNITY): Payer: Self-pay | Admitting: Psychiatry

## 2014-02-25 ENCOUNTER — Ambulatory Visit (INDEPENDENT_AMBULATORY_CARE_PROVIDER_SITE_OTHER): Payer: 59

## 2014-02-25 DIAGNOSIS — Z23 Encounter for immunization: Secondary | ICD-10-CM

## 2014-03-13 ENCOUNTER — Encounter: Payer: Self-pay | Admitting: Family Medicine

## 2014-03-13 ENCOUNTER — Ambulatory Visit (INDEPENDENT_AMBULATORY_CARE_PROVIDER_SITE_OTHER): Payer: 59 | Admitting: Family Medicine

## 2014-03-13 VITALS — BP 94/60 | HR 58 | Ht 67.0 in | Wt 139.0 lb

## 2014-03-13 DIAGNOSIS — M9903 Segmental and somatic dysfunction of lumbar region: Secondary | ICD-10-CM

## 2014-03-13 DIAGNOSIS — S76311A Strain of muscle, fascia and tendon of the posterior muscle group at thigh level, right thigh, initial encounter: Secondary | ICD-10-CM

## 2014-03-13 DIAGNOSIS — M9905 Segmental and somatic dysfunction of pelvic region: Secondary | ICD-10-CM

## 2014-03-13 DIAGNOSIS — M533 Sacrococcygeal disorders, not elsewhere classified: Secondary | ICD-10-CM

## 2014-03-13 DIAGNOSIS — M9904 Segmental and somatic dysfunction of sacral region: Secondary | ICD-10-CM

## 2014-03-13 DIAGNOSIS — M999 Biomechanical lesion, unspecified: Secondary | ICD-10-CM

## 2014-03-13 NOTE — Assessment & Plan Note (Signed)
Patient once again did respond very well to manipulation. Patient was given different home exercises that I think be more beneficial. We discussed the importance of hip abductor strengthening as well as stretching of the hip flexors. We also discussed continuing the over-the-counter medications the seem to be doing very well. Last and patient was seen was greater than 6 months ago which is wonderful. Patient will come back again in 6 weeks for further evaluation and treatment.

## 2014-03-13 NOTE — Assessment & Plan Note (Signed)
Decision today to treat with OMT was based on Physical Exam  After verbal consent patient was treated with HVLA and muscle energy techniques in cervical thoracic, lumbar, sacral and ilium areas  Patient tolerated the procedure well with improvement in symptoms  Patient given exercises, stretches and lifestyle modifications  See medications in patient instructions if given  Patient will follow up in 4-6 weeks             

## 2014-03-13 NOTE — Patient Instructions (Signed)
Good to see you.  Ice 20 minutes 2 times daily. Usually after activity and before bed. Exercises on wall.  Heel and butt touching.  Raise leg 6 inches and hold 2 seconds.  Down slow for count of 4 seconds.  1 set of 30 reps daily on both sides.  Continue to watch how you sleep.  See handout for hamstring Heat before and ice after.  Compression sleeve on hamstring with exercises.  See me again 4-6 weeks.

## 2014-03-13 NOTE — Assessment & Plan Note (Addendum)
Patient did respond very well to conservative therapy previously for other problems we will start with the hamstring as well. Patient had injury greater than 3 months ago. Patient was given Nordic exercises which I think be beneficial. We also discussed about compression sleeve that I think will be L4. We discussed icing regimen. Patient will come back in 3-4 weeks and we will make sure she continues to improve. If not I would like to do an ultrasound of the area for further evaluation.

## 2014-03-13 NOTE — Progress Notes (Signed)
[name] [address] [place], [place] [address] Phone: [phone] Subjective:     CC: Back pain follow up  [id number] Andrea Ortiz is a 53 y.o. female coming in with complaint of back pain. Patient does have chronic back pain as well as sacroiliac joint dysfunction. Patient has been doing conservative therapy and has responded well to manipulation therapy.  Patient has not been seen for greater than 5 months. Patient has been doing remarkably well but started having some mild discomfort. Patient states that it seems to be very similar to what she was having previously. Patient has become lackadaisical on doing the exercises on a regular basis. Patient has not taken any medications for this. Patient denies any symptoms such as radiation down her leg any numbness or tingling. She did respond significantly well to osteopathic manipulation in the past.  Patient does state that approximately 3 months ago she did injure her right hamstring. Is feeling much better but still has mild tightness. Patient is wondering if I would just evaluate.   PMHX: MRI was ordered. MRI was reviewed by me today. Patient at that time did have lumbar facet arthropathy with degenerative disc disease mostly at the L4-L5 area. Patient also had new x-rays which were also reviewed by me and. February 23rd 2015 lumbar and thoracic x-rays were taken and did show multilevel degenerative changes mostly of the facet arthropathy.   Past medical history, social, surgical and family history all reviewed in electronic medical record.   Review of Systems: No headache, visual changes, nausea, vomiting, diarrhea, constipation, dizziness, abdominal pain, skin rash, fevers, chills, night sweats, weight loss, swollen lymph nodes, body aches, joint swelling, muscle aches, chest pain, shortness of breath, mood changes.   Objective Blood pressure 94/60, pulse 58, height 5\' 7"  (1.702 m), weight 139  lb (63.05 kg), SpO2 98 %.  General: No apparent distress alert and oriented x3 mood and affect normal, dressed appropriately.  HEENT: Pupils equal, extraocular movements intact  Respiratory: Patient's speak in full sentences and does not appear short of breath  Cardiovascular: No lower extremity edema, non tender, no erythema  Skin: Warm dry intact with no signs of infection or rash on extremities or on axial skeleton.  Abdomen: Soft nontender  Neuro: Cranial nerves II through XII are intact, neurovascularly intact in all extremities with 2+ DTRs and 2+ pulses.  Lymph: No lymphadenopathy of posterior or anterior cervical chain or axillae bilaterally.  Gait normal with good balance and coordination.  MSK:  Non tender with full range of motion and good stability and symmetric strength and tone of shoulders, elbows, wrist, hip, knee and ankles bilaterally.  Back Exam:  Inspection: Mild dextroscoliosis of the lumbar spine Motion: Flexion 45 deg, Extension 45 deg, Side Bending to 45 deg bilaterally,  Rotation to 45 deg bilaterally  SLR laying: Negative  XSLR laying: Negative  Palpable tenderness: Mild tenderness over left SI jointpatient did have some mild tightness of the right hamstring compared to the contralateral side. No palpable defect noted. FABER: Positive on left Sensory change: Gross sensation intact to all lumbar and sacral dermatomes.  Reflexes: 2+ at both patellar tendons, 2+ at achilles tendons, Babinski's downgoing.  Strength at foot  Plantar-flexion: 5/5 Dorsi-flexion: 5/5 Eversion: 5/5 Inversion: 5/5  Leg strength  Quad: 5/5 Hamstring: 5/5 Hip flexor: 5/5 Hip abductors: 4/5  Gait unremarkable.   OMT Physical Exam  Cervical  C2 flexed rotated and side bent left C7 flexed rotated  and side bent right  Thoracic T5 extended rotated inside that right  Lumbar L2 flexed rotated inside that right  Sacrum Left on left      Impression and Recommendations:     This  case required medical decision making of moderate complexity.

## 2014-04-10 ENCOUNTER — Telehealth: Payer: Self-pay | Admitting: Internal Medicine

## 2014-04-10 NOTE — Telephone Encounter (Signed)
Left message for the pt to return my call. 

## 2014-04-10 NOTE — Telephone Encounter (Signed)
PLEASE NOTE: All timestamps contained within this report are represented as [place] Standard Time. CONFIDENTIALTY NOTICE: This fax transmission is intended only for the addressee. It contains information that is legally privileged, confidential or otherwise protected from use or disclosure. If you are not the intended recipient, you are strictly prohibited from reviewing, disclosing, copying using or disseminating any of this information or taking any action in reliance on or regarding this information. If you have received this fax in error, please notify us immediately by telephone so that we can arrange for its return to [place]. Phone: [phone], Toll-Free: [phone], Fax: [phone] Page: 1 of 2 Call Id: [id number] [place] [facility] - Client [place] Patient Name: Andrea Ortiz Gender: Female DOB: 07-08-1960 Age: 53 Y 41 M Return Phone Number: [phone] (Primary) Address: [address] City/State/Zip: [place] [address] Client [facility] - Client Client Site [place] - Day Physician Shanon Ace Contact Type Call Call Type Triage / Clinical Relationship To Patient Self Return Phone Number [phone] (Primary) Chief Complaint Unclassified Symptom Initial Comment Caller states snores, but lately has been waking up gasping for air PreDisposition Call Doctor Nurse Assessment Nurse: Mechele Dawley, RN, Amy Date/Time Eilene Ghazi Time): 04/10/2014 10:21:57 AM Confirm and document reason for call. If symptomatic, describe symptoms. ---CALLER STATES THAT SHE HAS ALWAYS BEEN A HEAVY SNORER. SHE STATES THAT RECENTLY SHE HAS BEEN WAKING UP GASPING FOR AIR. SHE STATES THE FACT THAT SHE CAN NOT BREATHE WAKES HER UP. SHE STATES THAT IT IS ALMOST NIGHTLY NOW. SHE STATES THIS HAS BEEN GOING ON FOR ABOUT 3 MONTHS NOW. HAS PROGRESSIVELY GOTTEN WORSE DURING THIS TIME. THIS IS AT  THE BEGINNING OF HER SLEEP PATTERN. SHE DOES NOT HAVE A HARD TIME GOING TO SLEEP. SHE DOES NOT HAVE ANY SOB DURING THE DAY. Has the patient traveled out of the country within the last 30 days? ---Not Applicable Does the patient require triage? ---Yes Related visit to physician within the last 2 weeks? ---No Does the PT have any chronic conditions? (i.e. diabetes, asthma, etc.) ---No Did the patient indicate they were pregnant? ---No Guidelines Guideline Title Affirmed Question Affirmed Notes Nurse Date/Time (Eastern Time) Breathing Difficulty [1] MODERATE longstanding difficulty breathing (e.g., speaks in phrases, SOB even at rest, pulse 100-120) AND [2] SAME as normal WILL NEED TO SEE PCP Bettles, RN, Amy 04/10/2014 10:23:54 AM Disp. Time Eilene Ghazi Time) Disposition Final User PLEASE NOTE: All timestamps contained within this report are represented as [place] Standard Time. CONFIDENTIALTY NOTICE: This fax transmission is intended only for the addressee. It contains information that is legally privileged, confidential or otherwise protected from use or disclosure. If you are not the intended recipient, you are strictly prohibited from reviewing, disclosing, copying using or disseminating any of this information or taking any action in reliance on or regarding this information. If you have received this fax in error, please notify us immediately by telephone so that we can arrange for its return to [place]. Phone: [phone], Toll-Free: [phone], Fax: [phone] Page: 2 of 2 Call Id: [id number] 04/10/2014 10:36:28 AM See PCP When Office is Open (within 3 days) Yes [place], RN, Amy Caller Understands: Yes Disagree/Comply: Comply Care Advice Given Per Guideline CALLED OFFICE AND CALLER WAS GIVEN APPT IN THE MORNING AT 0800 WITH DR. Maudie Mercury. SHE IS GOING TO TAKE THAT APPT. SHE WILL CALL BACK IN THE MEANTIME FOR ANY WORSENING IN SYMPTOMS. SEE PCP  WITHIN 3  DAYS: You need to be examined within 2 or 3 days. Call your doctor during regular office hours and make an appointment. (Note: if office will be open tomorrow, tell caller to call then, not in 3 days). GENERAL CARE ADVICE FOR BREATHING DIFFICULTY: CALL BACK IF: * Severe difficulty breathing occurs * You become worse CARE ADVICE given per Breathing Difficulty (Adult) guideline. After Care Instructions Given Call Event Type User Date / Time Description

## 2014-04-10 NOTE — Telephone Encounter (Signed)
PLEASE NOTE: All timestamps contained within this report are represented as [place] Standard Time. CONFIDENTIALTY NOTICE: This fax transmission is intended only for the addressee. It contains information that is legally privileged, confidential or otherwise protected from use or disclosure. If you are not the intended recipient, you are strictly prohibited from reviewing, disclosing, copying using or disseminating any of this information or taking any action in reliance on or regarding this information. If you have received this fax in error, please notify us immediately by telephone so that we can arrange for its return to [place]. Phone: [phone], Toll-Free: [phone], Fax: [phone] Page: 1 of 1 Call Id: [id number] [place] [facility] - Client [place] Patient Name: KAISYN REINHOLD Gender: Female DOB: 1961-01-04 Age: 53 Y 77 M Return Phone Number: [phone] (Primary) Address: [address] City/State/Zip: [place] [address] Client [facility] [place] [facility] - Client Client Site [place] - Day Physician Shanon Ace Contact Type Call Call Type Triage / Clinical Relationship To Patient Self Return Phone Number [phone] (Primary) Chief Complaint Unclassified Symptom Initial Comment Caller states she snores a lot and she wakes up gasping for air Nurse Assessment Guidelines Guideline Title Affirmed Question Affirmed Notes Nurse Date/Time ([place] Time) Disp. Time Eilene Ghazi Time) Disposition Final User 04/10/2014 8:58:21 AM Attempt made - message left [place], RN, Nira Conn 04/10/2014 9:16:37 AM Attempt made - no message left Standifer, RN, Nira Conn 04/10/2014 9:48:05 AM FINAL ATTEMPT MADE - no message left Yes Standifer, RN, Heather After Care Instructions Given Call Event Type User Date / Time Description

## 2014-04-11 ENCOUNTER — Ambulatory Visit (INDEPENDENT_AMBULATORY_CARE_PROVIDER_SITE_OTHER): Payer: 59 | Admitting: Family Medicine

## 2014-04-11 ENCOUNTER — Encounter: Payer: Self-pay | Admitting: Family Medicine

## 2014-04-11 VITALS — BP 100/62 | HR 79 | Temp 98.7°F | Ht 67.0 in | Wt 139.8 lb

## 2014-04-11 DIAGNOSIS — R0683 Snoring: Secondary | ICD-10-CM

## 2014-04-11 DIAGNOSIS — G4730 Sleep apnea, unspecified: Secondary | ICD-10-CM

## 2014-04-11 MED ORDER — TRAZODONE HCL 50 MG PO TABS: ORAL_TABLET | ORAL | Status: DC

## 2014-04-11 NOTE — Patient Instructions (Addendum)
-  schedule follow up with Dr. Regis Bill in 1 month to review your sleep study, options and continue to work with your medication  -taper off of the trazadone over the next 3 weeks  -We placed a referral for you as discussed for the sleep study. It usually takes about 1-2 weeks to process and schedule this referral. If you have not heard from [place] regarding this appointment in 2 weeks please contact our office.

## 2014-04-11 NOTE — Progress Notes (Signed)
Pre visit review using our clinic review tool, if applicable. No additional management support is needed unless otherwise documented below in the visit note. 

## 2014-04-11 NOTE — Progress Notes (Signed)
HPI:  ? Sleep apnea: -pt of Dr. Regis Bill  -reports: heavy snoring her whole life, report husband thinks she snorts at night and may stop breathing at night - she wants testing for sleep apnea  -reports husband can not even sleep with her due to the snoring -interestingly she thinks this worsened when started trazadone for sleep -she reports for at least 3 months sometime wakes up and feels like she stopped breathing - then fine once awake -reports can run on treadmill for 1 hour with no symptoms -no daytime somnelence, take trazadone to sleep and reports sleeps well -denies: SOB otherwise, palpitations - take metoprolol and no symptoms since, DOE, orthopnea when awake, fevers, malaise, cough, wheezing  ROS: See pertinent positives and negatives per HPI.  Past Medical History  Diagnosis Date  . 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 PSVT   . EXANTHEM   . PALPITATIONS, RECURRENT   . Chest pain 02/09/2011    Past Surgical History  Procedure Laterality Date  . Rhinoplasty  1980    Family History  Problem Relation Age of Onset  . Alzheimer's disease Mother   . Heart attack Mother     age 73's  . Parkinsonism Father   . Other Sister     Hypoglycemia    History   Social History  . Marital Status: Married    Spouse Name: N/A    Number of Children: N/A  . Years of Education: N/A   Social History Main Topics  . Smoking status: Former Smoker -- 1.50 packs/day for 15 years    Types: Cigarettes    Quit date: 08/12/1995  . Smokeless tobacco: Never Used  . Alcohol Use: 3.0 oz/week    5 Glasses of wine per week  . Drug Use: None  . Sexual Activity: None   Other Topics Concern  . None   Social History Narrative   Divorced- Remarried- Husband has a son at [place]   One child    Dog   Works [employer]      hhof 4  No ets.    Avoids caffiene etoh with dinner.   Exercise 5 d  Per week.    Sleep 6 hours or less     Current outpatient  prescriptions: desoximetasone (TOPICORT) 0.05 % cream, Apply topically 2 (two) times daily. For 2 weeks at a time , Disp: , Rfl: ;  fexofenadine (ALLEGRA) 180 MG tablet, Take 180 mg by mouth daily.  , Disp: , Rfl: ;  FLUoxetine (PROZAC) 20 MG capsule, , Disp: , Rfl: 1;  gabapentin (NEURONTIN) 300 MG capsule, , Disp: , Rfl: 2;  meloxicam (MOBIC) 15 MG tablet, Take 1 tablet (15 mg total) by mouth daily., Disp: 30 tablet, Rfl: 2 metoprolol (LOPRESSOR) 50 MG tablet, TAKE 1/2 TABLET BY MOUTH TWICE DAILY OR AS DIRECTED., Disp: 90 tablet, Rfl: 0;  PARoxetine (PAXIL) 10 MG tablet, Take 10 mg by mouth daily., Disp: , Rfl: ;  traZODone (DESYREL) 50 MG tablet, 50mg  qhs as instructed, Disp: 30 tablet, Rfl: 0  EXAM:  Filed Vitals:   04/11/14 0800  BP: 100/62  Pulse: 79  Temp: 98.7 F (37.1 C)    Body mass index is 21.89 kg/(m^2).  GENERAL: vitals reviewed and listed above, alert, oriented, appears well hydrated and in no acute distress  HEENT: atraumatic, conjunttiva clear, no obvious abnormalities on inspection of external nose and ears  NECK: no obvious masses on inspection  LUNGS: clear to auscultation  bilaterally, no wheezes, rales or rhonchi, good air movement  CV: HRRR, no peripheral edema  MS: moves all extremities without noticeable abnormality  PSYCH: pleasant and cooperative, no obvious depression or anxiety  ASSESSMENT AND PLAN:  Discussed the following assessment and plan:  Snoring - Plan: Ambulatory referral to Pulmonology  Sleep apnea - Plan: Ambulatory referral to Pulmonology  -sleep consult per her request -concern could be related to her trazadone so she has opted to taper off this -close follow up with Dr. Regis Bill in 1 month -Patient advised to return or notify a doctor immediately if symptoms worsen or persist or new concerns arise.  Patient Instructions  -schedule follow up with Dr. Regis Bill in 1 month to review your sleep study, options and continue to work with your  medication  -taper off of the trazadone over the next 3 weeks  -We placed a referral for you as discussed for the sleep study. It usually takes about 1-2 weeks to process and schedule this referral. If you have not heard from [place] regarding this appointment in 2 weeks please contact our office.      Colin Benton R.

## 2014-04-12 NOTE — Telephone Encounter (Signed)
Patient seen on 04/11/14 by Dr. Maudie Mercury.  Is scheduled for an upcoming sleep study on 05/27/14 and will see Dr. Regis Bill on 06/06/14 for follow up.

## 2014-04-17 ENCOUNTER — Ambulatory Visit (INDEPENDENT_AMBULATORY_CARE_PROVIDER_SITE_OTHER): Payer: 59 | Admitting: Family Medicine

## 2014-04-17 ENCOUNTER — Encounter: Payer: Self-pay | Admitting: Family Medicine

## 2014-04-17 VITALS — BP 106/68 | HR 58 | Ht 67.0 in | Wt 140.0 lb

## 2014-04-17 DIAGNOSIS — M999 Biomechanical lesion, unspecified: Secondary | ICD-10-CM

## 2014-04-17 DIAGNOSIS — M9904 Segmental and somatic dysfunction of sacral region: Secondary | ICD-10-CM

## 2014-04-17 DIAGNOSIS — M9903 Segmental and somatic dysfunction of lumbar region: Secondary | ICD-10-CM

## 2014-04-17 DIAGNOSIS — M9902 Segmental and somatic dysfunction of thoracic region: Secondary | ICD-10-CM

## 2014-04-17 DIAGNOSIS — M533 Sacrococcygeal disorders, not elsewhere classified: Secondary | ICD-10-CM

## 2014-04-17 MED ORDER — MELOXICAM 15 MG PO TABS: 15.0000 mg | ORAL_TABLET | Freq: Every day | ORAL | Status: DC

## 2014-04-17 NOTE — Assessment & Plan Note (Signed)
Decision today to treat with OMT was based on Physical Exam  After verbal consent patient was treated with HVLA and muscle energy techniques in cervical thoracic, lumbar, sacral and ilium areas  Patient tolerated the procedure well with improvement in symptoms  Patient given exercises, stretches and lifestyle modifications  See medications in patient instructions if given  Patient will follow up in 6 weeks               

## 2014-04-17 NOTE — Patient Instructions (Signed)
You are doing great!!!! See me in 6 weeks.

## 2014-04-17 NOTE — Assessment & Plan Note (Signed)
Continue the exercises and icing, discussed hip abductors, Continue to remain active,  Come back in 6 weeks.

## 2014-04-17 NOTE — Progress Notes (Signed)
  [name] [place], [place] [address] Phone: [phone] Subjective:     CC: Back pain follow up  [id number] Andrea Ortiz is a 53 y.o. female coming in with complaint of back pain. Patient does have chronic back pain as well as sacroiliac joint dysfunction. Patient has been doing conservative therapy and has responded well to manipulation therapy. Patient continues to do significantly well. Patient is not having any pain going down her legs or any new symptoms. Patient is able to do all activities she enjoys.    PMHX: MRI shows patient at that time did have lumbar facet arthropathy with degenerative disc disease mostly at the L4-L5 area.  February 23rd 2015 lumbar and thoracic x-rays were taken and did show multilevel degenerative changes mostly of the facet arthropathy.   Past medical history, social, surgical and family history all reviewed in electronic medical record.   Review of Systems: No headache, visual changes, nausea, vomiting, diarrhea, constipation, dizziness, abdominal pain, skin rash, fevers, chills, night sweats, weight loss, swollen lymph nodes, body aches, joint swelling, muscle aches, chest pain, shortness of breath, mood changes.   Objective Blood pressure 106/68, pulse 58, height 5\' 7"  (1.702 m), weight 140 lb (63.504 kg), SpO2 98 %.  General: No apparent distress alert and oriented x3 mood and affect normal, dressed appropriately.  HEENT: Pupils equal, extraocular movements intact  Respiratory: Patient's speak in full sentences and does not appear short of breath  Cardiovascular: No lower extremity edema, non tender, no erythema  Skin: Warm dry intact with no signs of infection or rash on extremities or on axial skeleton.  Abdomen: Soft nontender  Neuro: Cranial nerves II through XII are intact, neurovascularly intact in all extremities with 2+ DTRs and 2+ pulses.  Lymph: No lymphadenopathy of posterior or  anterior cervical chain or axillae bilaterally.  Gait normal with good balance and coordination.  MSK:  Non tender with full range of motion and good stability and symmetric strength and tone of shoulders, elbows, wrist, hip, knee and ankles bilaterally.  Back Exam:  Inspection: Mild dextroscoliosis of the lumbar spine Motion: Flexion 45 deg, Extension 45 deg, Side Bending to 45 deg bilaterally,  Rotation to 45 deg bilaterally  SLR laying: Negative  XSLR laying: Negative  Palpable tenderness: No tightness in the hamstring that was seen previously. Still minimal tenderness over the left SI joint FABER: Positive on left Sensory change: Gross sensation intact to all lumbar and sacral dermatomes.  Reflexes: 2+ at both patellar tendons, 2+ at achilles tendons, Babinski's downgoing.  Strength at foot  Plantar-flexion: 5/5 Dorsi-flexion: 5/5 Eversion: 5/5 Inversion: 5/5  Leg strength  Quad: 5/5 Hamstring: 5/5 Hip flexor: 5/5 Hip abductors: 4/5  Gait unremarkable.   OMT Physical Exam  Cervical  C2 flexed rotated and side bent left C5 flexed rotated and side bent right  Thoracic T5 extended rotated inside that right T3 extended rotated and side bent left  Lumbar L2 flexed rotated inside that right  Sacrum Left on left      Impression and Recommendations:     This case required medical decision making of moderate complexity.

## 2014-05-13 ENCOUNTER — Ambulatory Visit: Payer: Self-pay | Admitting: Internal Medicine

## 2014-05-27 ENCOUNTER — Institutional Professional Consult (permissible substitution): Payer: Self-pay | Admitting: Pulmonary Disease

## 2014-05-29 ENCOUNTER — Encounter: Payer: Self-pay | Admitting: Family Medicine

## 2014-05-29 ENCOUNTER — Ambulatory Visit (INDEPENDENT_AMBULATORY_CARE_PROVIDER_SITE_OTHER): Payer: 59 | Admitting: Family Medicine

## 2014-05-29 VITALS — BP 108/70 | HR 60 | Ht 67.0 in | Wt 140.0 lb

## 2014-05-29 DIAGNOSIS — M533 Sacrococcygeal disorders, not elsewhere classified: Secondary | ICD-10-CM

## 2014-05-29 DIAGNOSIS — M9904 Segmental and somatic dysfunction of sacral region: Secondary | ICD-10-CM

## 2014-05-29 DIAGNOSIS — M9905 Segmental and somatic dysfunction of pelvic region: Secondary | ICD-10-CM

## 2014-05-29 DIAGNOSIS — M9902 Segmental and somatic dysfunction of thoracic region: Secondary | ICD-10-CM

## 2014-05-29 DIAGNOSIS — M9903 Segmental and somatic dysfunction of lumbar region: Secondary | ICD-10-CM

## 2014-05-29 DIAGNOSIS — M999 Biomechanical lesion, unspecified: Secondary | ICD-10-CM

## 2014-05-29 NOTE — Assessment & Plan Note (Signed)
Decision today to treat with OMT was based on Physical Exam  After verbal consent patient was treated with HVLA and muscle energy techniques in cervical thoracic, lumbar, sacral and ilium areas  Patient tolerated the procedure well with improvement in symptoms  Patient given exercises, stretches and lifestyle modifications  See medications in patient instructions if given  Patient will follow up in 7-8 weeks

## 2014-05-29 NOTE — Progress Notes (Signed)
  [facility] [place],  [address] Phone: [phone] Subjective:     CC: Back pain follow up  [id number] Andrea Ortiz is a 54 y.o. female coming in with complaint of back pain. Patient does have chronic back pain as well as sacroiliac joint dysfunction. Patient has been doing conservative therapy and has responded well to manipulation therapy. Patient is started more exercises recently and states that she is does have some mild soreness of the low back. Patient states that this is very minimal overall. Patient states it is not stopping her from any activity. Still doing significantly well. Continues to take the gabapentin fairly regularly. Patient continues to wear the heel lift to help with the leg length discrepancy. Denies any radiation down the leg or any numbness or tingling.    PMHX: MRI shows patient at that time did have lumbar facet arthropathy with degenerative disc disease mostly at the L4-L5 area.  February 23rd 2015 lumbar and thoracic x-rays were taken and did show multilevel degenerative changes mostly of the facet arthropathy.   Past medical history, social, surgical and family history all reviewed in electronic medical record.   Review of Systems: No headache, visual changes, nausea, vomiting, diarrhea, constipation, dizziness, abdominal pain, skin rash, fevers, chills, night sweats, weight loss, swollen lymph nodes, body aches, joint swelling, muscle aches, chest pain, shortness of breath, mood changes.   Objective Blood pressure 108/70, pulse 60, height 5\' 7"  (1.702 m), weight 140 lb (63.504 kg), SpO2 98 %.  General: No apparent distress alert and oriented x3 mood and affect normal, dressed appropriately.  HEENT: Pupils equal, extraocular movements intact  Respiratory: Patient's speak in full sentences and does not appear short of breath  Cardiovascular: No lower extremity edema, non tender, no erythema  Skin:  Warm dry intact with no signs of infection or rash on extremities or on axial skeleton.  Abdomen: Soft nontender  Neuro: Cranial nerves II through XII are intact, neurovascularly intact in all extremities with 2+ DTRs and 2+ pulses.  Lymph: No lymphadenopathy of posterior or anterior cervical chain or axillae bilaterally.  Gait normal with good balance and coordination.  MSK:  Non tender with full range of motion and good stability and symmetric strength and tone of shoulders, elbows, wrist, hip, knee and ankles bilaterally.  Back Exam:  Inspection: Mild dextroscoliosis of the lumbar spine no significant change Motion: Flexion 45 deg, Extension 45 deg, Side Bending to 45 deg bilaterally,  Rotation to 45 deg bilaterally  SLR laying: Negative  XSLR laying: Negative  Palpable tenderness: No tightness in the hamstring that was seen previously. Nontender over the left SI joint FABER: Positive on left Sensory change: Gross sensation intact to all lumbar and sacral dermatomes.  Reflexes: 2+ at both patellar tendons, 2+ at achilles tendons, Babinski's downgoing.  Strength at foot  Plantar-flexion: 5/5 Dorsi-flexion: 5/5 Eversion: 5/5 Inversion: 5/5  Leg strength  Quad: 5/5 Hamstring: 5/5 Hip flexor: 5/5 Hip abductors: 4/5  Gait unremarkable.   OMT Physical Exam  Cervical  C2 flexed rotated and side bent left C5 flexed rotated and side bent right  Thoracic T5 extended rotated inside that right T3 extended rotated and side bent left  Lumbar L2 flexed rotated inside that right  Sacrum Left on left      Impression and Recommendations:     This case required medical decision making of moderate complexity.

## 2014-05-29 NOTE — Patient Instructions (Signed)
You are doing great! Ice is your friend especially after activity.  Continue the vitamins Exercises on wall.  Heel and butt touching.  Raise leg 6 inches and hold 2 seconds.  Down slow for count of 4 seconds.  1 set of 30 reps daily on both sides.  On all fours lift opposite arm and leg hold 2 seconds down slow for cunt of 4 seconds.  Repeat 10 times each side See me again in 7-8 weeks.

## 2014-05-29 NOTE — Progress Notes (Signed)
Pre visit review using our clinic review tool, if applicable. No additional management support is needed unless otherwise documented below in the visit note. 

## 2014-05-29 NOTE — Assessment & Plan Note (Signed)
Patient continues to have some dysfunction. Patient was given 2 new exercises and was showed proper technique today. Patient will continue to work with her personal trainer and continue to do the boot can. We discussed continuing the over-the-counter supplementation. Patient does have the gabapentin if she needs. Patient is doing significantly better though. Patient will decrease the frequency of office visits to 78 weeks intervals.

## 2014-06-06 ENCOUNTER — Ambulatory Visit: Payer: Self-pay | Admitting: Internal Medicine

## 2014-07-16 ENCOUNTER — Ambulatory Visit (INDEPENDENT_AMBULATORY_CARE_PROVIDER_SITE_OTHER): Payer: 59 | Admitting: Pulmonary Disease

## 2014-07-16 ENCOUNTER — Encounter: Payer: Self-pay | Admitting: Pulmonary Disease

## 2014-07-16 VITALS — BP 100/64 | HR 57 | Ht 67.0 in | Wt 141.0 lb

## 2014-07-16 DIAGNOSIS — R0683 Snoring: Secondary | ICD-10-CM

## 2014-07-16 NOTE — Assessment & Plan Note (Signed)
Given excessive daytime somnolence, narrow pharyngeal exam, witnessed apneas & loud snoring, obstructive sleep apnea is very likely & an overnight polysomnogram will be scheduled as a home study. The pathophysiology of obstructive sleep apnea , it's cardiovascular consequences & modes of treatment including CPAP were discused with the patient in detail & they evidenced understanding. Pre test prob is low If negative, would treat for snoring

## 2014-07-16 NOTE — Patient Instructions (Signed)
Home sleep study 

## 2014-07-16 NOTE — Progress Notes (Signed)
Subjective:    Patient ID: Andrea Ortiz, female    DOB: April 11, 1961, 54 y.o.   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 She reports that she has always snored as an adult. She was recently placed on trazodone during alcohol rehabilitation. Her husband has noted difficulty breathing due to loud snoring and gasping episodes during sleep. Her husband sleeps in a different bedroom due to her snoring. Epworth sleepiness score is 4. Bedtime is around 10 PM, sleep latency is minimal with trazodone, she sleeps on her left side with 2 pillows, wakes up twice including bathroom visits and is out of bed by 5 AM feeling tired but denies dryness of mouth or headaches. Her weight has more or less remained constant. She has an active lifestyle and works out every morning. 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   Past Medical History  Diagnosis Date  . 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 PSVT   . EXANTHEM   . PALPITATIONS, RECURRENT   . Chest pain 02/09/2011    Past Surgical History  Procedure Laterality Date  . Rhinoplasty  1980    Allergies  Allergen Reactions  . Escitalopram Oxalate     REACTION: hand s swelled up.    History   Social History  . Marital Status: Married    Spouse Name: N/A  . Number of Children: N/A  . Years of Education: N/A   Occupational History  . Not on file.   Social History Main Topics  . Smoking status: Former Smoker -- 1.50 packs/day for 15 years    Types: Cigarettes    Quit date: 08/12/1995  . Smokeless tobacco: Never Used  . Alcohol Use: 3.0 oz/week    5 Glasses of wine per week  . Drug Use: Not on file  . Sexual Activity: Not on file   Other Topics Concern  . Not on file   Social History Narrative   Divorced- Remarried- Husband has a son at [place]   One child    Dog   Works [employer]      hhof 4  No ets.    Avoids caffiene etoh with dinner.   Exercise 5 d  Per week.    Sleep 6 hours or less     Family History  Problem Relation Age of Onset  . Alzheimer's disease Mother   . Heart attack Mother     age 60's  . Parkinsonism Father   . Other Sister     Hypoglycemia    Review of Systems  Constitutional: Negative for fever and unexpected weight change.  HENT: Negative for congestion, dental problem, ear pain, nosebleeds, postnasal drip, rhinorrhea, sinus pressure, sneezing, sore throat and trouble swallowing.   Eyes: Negative for redness and itching.  Respiratory: Positive for choking. Negative for cough, chest tightness, shortness of breath and wheezing.   Cardiovascular: Negative for palpitations and leg swelling.  Gastrointestinal: Negative for nausea and vomiting.  Genitourinary: Negative for dysuria.  Musculoskeletal: Negative for joint swelling.  Skin: Negative for rash.  Neurological: Negative for headaches.  Hematological: Does not bruise/bleed easily.  Psychiatric/Behavioral: Negative for dysphoric mood. The patient is not nervous/anxious.        Objective:   Physical Exam  Gen. Pleasant, well-nourished, in no distress, normal affect ENT - no lesions, no post nasal drip Neck: No  JVD, no thyromegaly, no carotid bruits Lungs: no use of accessory muscles, no dullness to percussion, clear without rales or rhonchi  Cardiovascular: Rhythm regular, heart sounds  normal, no murmurs or gallops, no peripheral edema Abdomen: soft and non-tender, no hepatosplenomegaly, BS normal. Musculoskeletal: No deformities, no cyanosis or clubbing Neuro:  alert, non focal       Assessment & Plan:

## 2014-07-25 ENCOUNTER — Ambulatory Visit: Payer: Self-pay | Admitting: Family Medicine

## 2014-08-05 ENCOUNTER — Encounter: Payer: 59 | Admitting: Internal Medicine

## 2014-08-05 NOTE — Progress Notes (Signed)
Document opened and reviewed for OV but appt  canceled same day .  

## 2014-08-14 ENCOUNTER — Encounter: Payer: Self-pay | Admitting: Family Medicine

## 2014-08-14 ENCOUNTER — Ambulatory Visit (INDEPENDENT_AMBULATORY_CARE_PROVIDER_SITE_OTHER): Payer: 59 | Admitting: Family Medicine

## 2014-08-14 ENCOUNTER — Telehealth: Payer: Self-pay | Admitting: Family Medicine

## 2014-08-14 VITALS — BP 108/70 | HR 60 | Ht 67.0 in | Wt 140.0 lb

## 2014-08-14 DIAGNOSIS — M899 Disorder of bone, unspecified: Secondary | ICD-10-CM

## 2014-08-14 DIAGNOSIS — M533 Sacrococcygeal disorders, not elsewhere classified: Secondary | ICD-10-CM | POA: Diagnosis not present

## 2014-08-14 DIAGNOSIS — M999 Biomechanical lesion, unspecified: Secondary | ICD-10-CM

## 2014-08-14 DIAGNOSIS — M9902 Segmental and somatic dysfunction of thoracic region: Secondary | ICD-10-CM | POA: Diagnosis not present

## 2014-08-14 MED ORDER — DICLOFENAC SODIUM 2 % TD SOLN: TRANSDERMAL | Status: DC

## 2014-08-14 NOTE — Assessment & Plan Note (Signed)
Hasn't has some dysfunction overall. We discussed icing regimen and home exercises. We discussed range of motion exercises and patient didn't learn proper technique by athletic trainer today. We discussed different postural changes again be helpful and patient will come back in 4-6 weeks for further evaluation and treatment.

## 2014-08-14 NOTE — Patient Instructions (Signed)
Good to see you Ice 20 minutes 2 times daily. Usually after activity and before bed. To the knee B12 1031mcg daily B6 200mg  daily Watch the knee and if pain come back.  See me again in 4- 6 weeks.    Standing:  Secure a rubber exercise band/tubing so that it is at the height of your shoulders when you are either standing or sitting on a firm arm-less chair.  Grasp an end of the band/tubing in each hand and have your palms face each other. Straighten your elbows and lift your hands straight in front of you at shoulder height. Step back away from the secured end of band/tubing until it becomes tense.  Squeeze your shoulder blades together. Keeping your elbows locked and your hands at shoulder-height, bring your hands out to your side.  Hold __________ seconds. Slowly ease the tension on the band/tubing as you reverse the directions and return to the starting position. Repeat __________ times. Complete this exercise __________ times per day. STRENGTH - Scapular Retractors  Secure a rubber exercise band/tubing so that it is at the height of your shoulders when you are either standing or sitting on a firm arm-less chair.  With a palm-down grip, grasp an end of the band/tubing in each hand. Straighten your elbows and lift your hands straight in front of you at shoulder height. Step back away from the secured end of band/tubing until it becomes tense.  Squeezing your shoulder blades together, draw your elbows back as you bend them. Keep your upper arm lifted away from your body throughout the exercise.  Hold __________ seconds. Slowly ease the tension on the band/tubing as you reverse the directions and return to the starting position. Repeat __________ times. Complete this exercise __________ times per day. STRENGTH - Shoulder Extensors   Secure a rubber exercise band/tubing so that it is at the height of your shoulders when you are either standing or sitting on a firm arm-less chair.  With  a thumbs-up grip, grasp an end of the band/tubing in each hand. Straighten your elbows and lift your hands straight in front of you at shoulder height. Step back away from the secured end of band/tubing until it becomes tense.  Squeezing your shoulder blades together, pull your hands down to the sides of your thighs. Do not allow your hands to go behind you.  Hold for __________ seconds. Slowly ease the tension on the band/tubing as you reverse the directions and return to the starting position. Repeat __________ times. Complete this exercise __________ times per day.  STRENGTH - Scapular Retractors and External Rotators  Secure a rubber exercise band/tubing so that it is at the height of your shoulders when you are either standing or sitting on a firm arm-less chair.  With a palm-down grip, grasp an end of the band/tubing in each hand. Bend your elbows 90 degrees and lift your elbows to shoulder height at your sides. Step back away from the secured end of band/tubing until it becomes tense.  Squeezing your shoulder blades together, rotate your shoulder so that your upper arm and elbow remain stationary, but your fists travel upward to head-height.  Hold __________ for seconds. Slowly ease the tension on the band/tubing as you reverse the directions and return to the starting position. Repeat __________ times. Complete this exercise __________ times per day.  STRENGTH - Scapular Retractors and External Rotators, Rowing  Secure a rubber exercise band/tubing so that it is at the height of your shoulders when  you are either standing or sitting on a firm arm-less chair.  With a palm-down grip, grasp an end of the band/tubing in each hand. Straighten your elbows and lift your hands straight in front of you at shoulder height. Step back away from the secured end of band/tubing until it becomes tense.  Step 1: Squeeze your shoulder blades together. Bending your elbows, draw your hands to your chest as  if you are rowing a boat. At the end of this motion, your hands and elbow should be at shoulder-height and your elbows should be out to your sides.  Step 2: Rotate your shoulder to raise your hands above your head. Your forearms should be vertical and your upper-arms should be horizontal.  Hold for __________ seconds. Slowly ease the tension on the band/tubing as you reverse the directions and return to the starting position. Repeat __________ times. Complete this exercise __________ times per day.  STRENGTH - Scapular Retractors and Elevators  Secure a rubber exercise band/tubing so that it is at the height of your shoulders when you are either standing or sitting on a firm arm-less chair.  With a thumbs-up grip, grasp an end of the band/tubing in each hand. Step back away from the secured end of band/tubing until it becomes tense.  Squeezing your shoulder blades together, straighten your elbows and lift your hands straight over your head.  Hold for __________ seconds. Slowly ease the tension on the band/tubing as you reverse the directions and return to the starting position. Repeat __________ times. Complete this exercise __________ times per day.  Document Released: 04/19/2005 Document Revised: 07/12/2011 Document Reviewed: 08/01/2008 [facility] Patient Information 2015 [place], [place]. This information is not intended to replace advice given to you by your health care provider. Make sure you discuss any questions you have with your health care provider.

## 2014-08-14 NOTE — Progress Notes (Signed)
Pre visit review using our clinic review tool, if applicable. No additional management support is needed unless otherwise documented below in the visit note. 

## 2014-08-14 NOTE — Assessment & Plan Note (Signed)
Patient continues to respond well to conservative therapy and is continuing to be very active with good core strength. Patient with continue the same medications and we discussed icing regimen. Patient given some other postural changes that could be beneficial. Patient come back in 4-6 weeks for further evaluation and treatment.

## 2014-08-14 NOTE — Progress Notes (Signed)
Andrea Ortiz, Andrea Ortiz [address] Phone: [phone] Subjective:     CC: Back pain follow up  [id number] Andrea Ortiz is a 54 y.o. female coming in with complaint of back pain. Patient does have chronic back pain as well as sacroiliac joint dysfunction. Patient has been doing conservative therapy and has responded well to manipulation therapy. Patient is started more exercises recently and states that she is does have some mild soreness of the low back. Patient is been doing the exercises very regularly. Patient states that she has had some soreness over the course last 2 weeks. Denies any numbness or tingling. Denies any radiation of pain. States that it is not stopping her from activity but is much more uncomfortable than usual. Denies any nighttime awakening. Continuing the vitamins on a fairly regular basis and is continuing to work on a regular basis.  Patient has noticed some mild increase in upper back pain. Mostly on the left side. More discomfort than anything. No radiation. Rates the severity of 3 out of 10. Not stopping him from her daily activities. Scrubs it as a dull aching sensation.    PMHX: MRI shows patient at that time did have lumbar facet arthropathy with degenerative disc disease mostly at the L4-L5 area.  February 23rd 2015 lumbar and thoracic x-rays were taken and did show multilevel degenerative changes mostly of the facet arthropathy.   Past medical history, social, surgical and family history all reviewed in electronic medical record.   Review of Systems: No headache, visual changes, nausea, vomiting, diarrhea, constipation, dizziness, abdominal pain, skin rash, fevers, chills, night sweats, weight loss, swollen lymph nodes, body aches, joint swelling, muscle aches, chest pain, shortness of breath, mood changes.   Objective Blood pressure 108/70, pulse 60, height 5\' 7"  (1.702 m), weight 140 lb (63.504 kg),  SpO2 97 %.  General: No apparent distress alert and oriented x3 mood and affect normal, dressed appropriately.  HEENT: Pupils equal, extraocular movements intact  Respiratory: Patient's speak in full sentences and does not appear short of breath  Cardiovascular: No lower extremity edema, non tender, no erythema  Skin: Warm dry intact with no signs of infection or rash on extremities or on axial skeleton.  Abdomen: Soft nontender  Neuro: Cranial nerves II through XII are intact, neurovascularly intact in all extremities with 2+ DTRs and 2+ pulses.  Lymph: No lymphadenopathy of posterior or anterior cervical chain or axillae bilaterally.  Gait normal with good balance and coordination.  MSK:  Non tender with full range of motion and good stability and symmetric strength and tone of shoulders, elbows, wrist, hip, knee and ankles bilaterally.  Back Exam:  Inspection: Mild dextroscoliosis of the lumbar spine no significant change Motion: Flexion 45 deg, Extension 45 deg, Side Bending to 45 deg bilaterally,  Rotation to 45 deg bilaterally  SLR laying: Negative  XSLR laying: Negative  Palpable tenderness: Mild increasing tightness of the scapular region  FABER: Positive on left Sensory change: Gross sensation intact to all lumbar and sacral dermatomes.  Reflexes: 2+ at both patellar tendons, 2+ at achilles tendons, Babinski's downgoing.  Strength at foot  Plantar-flexion: 5/5 Dorsi-flexion: 5/5 Eversion: 5/5 Inversion: 5/5  Leg strength  Quad: 5/5 Hamstring: 5/5 Hip flexor: 5/5 Hip abductors: 4/5  Gait unremarkable. Mild scapular dysfunction of the left side   OMT Physical Exam  Cervical  C2 flexed rotated and side bent left C5 flexed rotated and side bent right  Thoracic T5 extended rotated inside that right T3 extended rotated and side bent left with inhaled rib  Lumbar L2 flexed rotated inside that right  Sacrum Left on left      Impression and Recommendations:     This  case required medical decision making of moderate complexity.

## 2014-08-14 NOTE — Telephone Encounter (Signed)
rx sent into pharmacy. Pt made aware.  

## 2014-08-14 NOTE — Assessment & Plan Note (Signed)
Decision today to treat with OMT was based on Physical Exam  After verbal consent patient was treated with HVLA and muscle energy techniques in cervical thoracic, lumbar, sacral and ilium areas  Patient tolerated the procedure well with improvement in symptoms  Patient given exercises, stretches and lifestyle modifications  See medications in patient instructions if given  Patient will follow up in 4-6 weeks

## 2014-08-14 NOTE — Telephone Encounter (Signed)
Dr. Tamala Julian was to send a prescription for Pennsaid (I didn't find this in his med list) to [facility] in [place] and they have not received it yet.

## 2014-08-29 DIAGNOSIS — G4730 Sleep apnea, unspecified: Secondary | ICD-10-CM | POA: Diagnosis not present

## 2014-08-30 ENCOUNTER — Telehealth: Payer: Self-pay | Admitting: Pulmonary Disease

## 2014-08-30 NOTE — Telephone Encounter (Signed)
Patient will check with her dental office to see if they can do the dental appliance.  Will call us back if we need to do anything to help with the referral.  Nothing further needed.

## 2014-08-30 NOTE — Telephone Encounter (Signed)
Could use dental appliance OK to refer to Dr Ron Parker - if hse does not have own dentist

## 2014-08-30 NOTE — Telephone Encounter (Signed)
Patient notified of results.  Patient does not want to use CPAP because she said that a friend had problems with CPAP damaging her teeth.  She wanted to know if [facility] is an option for her?  Please advise.

## 2014-08-30 NOTE — Telephone Encounter (Signed)
Home sleep test shows mild OSA. AHI was 11 per hour. She would benefit from a trial of CPAP therapy as we discussed. If willing, proceed with CPAP titration study

## 2014-09-02 ENCOUNTER — Other Ambulatory Visit: Payer: Self-pay | Admitting: *Deleted

## 2014-09-02 ENCOUNTER — Telehealth: Payer: Self-pay | Admitting: Pulmonary Disease

## 2014-09-02 DIAGNOSIS — R0683 Snoring: Secondary | ICD-10-CM

## 2014-09-02 DIAGNOSIS — G4730 Sleep apnea, unspecified: Secondary | ICD-10-CM

## 2014-09-02 DIAGNOSIS — G4733 Obstructive sleep apnea (adult) (pediatric): Secondary | ICD-10-CM

## 2014-09-02 NOTE — Telephone Encounter (Signed)
Rigoberto Noel, MD at 08/30/2014 12:28 PM     Status: Signed       Expand All Collapse All   Home sleep test shows mild OSA. AHI was 11 per hour. She would benefit from a trial of CPAP therapy as we discussed. If willing, proceed with CPAP titration study      --  Called pt and she reports she would like to proceed with CPAP titration study. I have placed order. Nothing further needed

## 2014-09-13 ENCOUNTER — Telehealth: Payer: Self-pay | Admitting: *Deleted

## 2014-09-13 NOTE — Telephone Encounter (Signed)
Left message on pts cell phone to call back.  Need to make sure pt is scheduled for a mammogram.  She is not due till 10/03/14

## 2014-09-18 ENCOUNTER — Ambulatory Visit: Payer: Self-pay | Admitting: Family Medicine

## 2014-10-03 ENCOUNTER — Ambulatory Visit: Payer: 59 | Admitting: Family Medicine

## 2014-10-16 ENCOUNTER — Other Ambulatory Visit: Payer: Self-pay | Admitting: Family Medicine

## 2014-10-16 ENCOUNTER — Telehealth: Payer: Self-pay | Admitting: Internal Medicine

## 2014-10-16 DIAGNOSIS — Z0000 Encounter for general adult medical examination without abnormal findings: Secondary | ICD-10-CM

## 2014-10-16 MED ORDER — METOPROLOL TARTRATE 50 MG PO TABS: ORAL_TABLET | ORAL | Status: DC

## 2014-10-16 NOTE — Telephone Encounter (Signed)
Pt request refill metoprolol (LOPRESSOR) 50 MG tablet 90 day New pharm  [facility] [place]

## 2014-10-16 NOTE — Telephone Encounter (Signed)
90 day supply sent to the pharmacy by e-scribe. Pt is past due for lab work and yearly cpx.  Please contact the pt to make both appointments.  I have placed the lab orders in the system.

## 2014-10-21 ENCOUNTER — Telehealth: Payer: Self-pay | Admitting: Pulmonary Disease

## 2014-10-21 DIAGNOSIS — R0683 Snoring: Secondary | ICD-10-CM

## 2014-10-21 NOTE — Telephone Encounter (Signed)
Home study OK

## 2014-10-21 NOTE — Telephone Encounter (Signed)
Order placed for HST. thanks

## 2014-10-21 NOTE — Telephone Encounter (Signed)
Pt's HST Order has been placed in our folder for scheduling.  I spoke with the Sleep Lab & cancelled her in-lab study that had been scheduled 11/05/14.  We will call pt to schedule her to pick up one of our HST machines.

## 2014-10-21 NOTE — Telephone Encounter (Signed)
Dr Elsworth Soho do you want to do a peer to peer or do you want to change pt to HST?

## 2014-10-28 ENCOUNTER — Encounter: Payer: Self-pay | Admitting: Family Medicine

## 2014-10-28 ENCOUNTER — Ambulatory Visit (INDEPENDENT_AMBULATORY_CARE_PROVIDER_SITE_OTHER): Payer: 59 | Admitting: Family Medicine

## 2014-10-28 VITALS — BP 118/80 | HR 71 | Ht 67.0 in | Wt 135.0 lb

## 2014-10-28 DIAGNOSIS — M533 Sacrococcygeal disorders, not elsewhere classified: Secondary | ICD-10-CM

## 2014-10-28 DIAGNOSIS — M9904 Segmental and somatic dysfunction of sacral region: Secondary | ICD-10-CM | POA: Diagnosis not present

## 2014-10-28 DIAGNOSIS — M999 Biomechanical lesion, unspecified: Secondary | ICD-10-CM

## 2014-10-28 NOTE — Progress Notes (Signed)
  [name] [address] [place], [place] [address] Phone: [phone] Subjective:     CC: Back pain follow up  [id number] Andrea Ortiz is a 54 y.o. female coming in with complaint of back pain. Patient does have chronic back pain as well as sacroiliac joint dysfunction. Patient has been doing conservative therapy and has responded well to manipulation therapy. Patient is started more exercises recently and states that she is does have some mild soreness of the low back. Significantly different exercises and has been feeling much better. Patient has started doing more dancing as well. Overall feels wonderful. Has some mild increase in stress recently with some mild increase in neck tightness but no real pain.    PMHX: MRI shows patient at that time did have lumbar facet arthropathy with degenerative disc disease mostly at the L4-L5 area.  February 23rd 2015 lumbar and thoracic x-rays were taken and did show multilevel degenerative changes mostly of the facet arthropathy.   Past medical history, social, surgical and family history all reviewed in electronic medical record.   Review of Systems: No headache, visual changes, nausea, vomiting, diarrhea, constipation, dizziness, abdominal pain, skin rash, fevers, chills, night sweats, weight loss, swollen lymph nodes, body aches, joint swelling, muscle aches, chest pain, shortness of breath, mood changes.   Objective Blood pressure 118/80, pulse 71, weight 135 lb (61.236 kg), SpO2 99 %.  General: No apparent distress alert and oriented x3 mood and affect normal, dressed appropriately.  HEENT: Pupils equal, extraocular movements intact  Respiratory: Patient's speak in full sentences and does not appear short of breath  Cardiovascular: No lower extremity edema, non tender, no erythema  Skin: Warm dry intact with no signs of infection or rash on extremities or on axial skeleton.  Abdomen: Soft nontender    Neuro: Cranial nerves II through XII are intact, neurovascularly intact in all extremities with 2+ DTRs and 2+ pulses.  Lymph: No lymphadenopathy of posterior or anterior cervical chain or axillae bilaterally.  Gait normal with good balance and coordination.  MSK:  Non tender with full range of motion and good stability and symmetric strength and tone of shoulders, elbows, wrist, hip, knee and ankles bilaterally.  Back Exam:  Inspection: Mild dextroscoliosis of the lumbar spine  Motion: Flexion 45 deg, Extension 45 deg, Side Bending to 45 deg bilaterally,  Rotation to 45 deg bilaterally  SLR laying: Negative  XSLR laying: Negative  Palpable tenderness:  FABER: Negative which is an improvement Sensory change: Gross sensation intact to all lumbar and sacral dermatomes.  Reflexes: 2+ at both patellar tendons, 2+ at achilles tendons, Babinski's downgoing.  Strength at foot  Plantar-flexion: 5/5 Dorsi-flexion: 5/5 Eversion: 5/5 Inversion: 5/5  Leg strength  Quad: 5/5 Hamstring: 5/5 Hip flexor: 5/5 Hip abductors: 4/5  Gait unremarkable. Mild scapular dysfunction of the left side   OMT Physical Exam  Cervical  C2 flexed rotated and side bent left C5 flexed rotated and side bent right  Thoracic T5 extended rotated inside that right T3 extended rotated and side bent left with inhaled rib  Lumbar L2 flexed rotated inside that right  Sacrum Left on left      Impression and Recommendations:     This case required medical decision making of moderate complexity.

## 2014-10-28 NOTE — Assessment & Plan Note (Signed)
Patient continues to do relatively well. Patient is working on a regular basis and I think is beneficial. We discussed icing regimen and home exercises. We discussed which activities to potentially avoid. Patient will try to make these different changes and come back and see me again in 3-4 weeks for further evaluation and treatment.

## 2014-10-28 NOTE — Patient Instructions (Addendum)
Good to see you Ice is your friend New stretches after class Continue the vitamins You are doing great See me again in 6 weeks.

## 2014-10-28 NOTE — Progress Notes (Signed)
Pre visit review using our clinic review tool, if applicable. No additional management support is needed unless otherwise documented below in the visit note. 

## 2014-10-28 NOTE — Assessment & Plan Note (Signed)
Decision today to treat with OMT was based on Physical Exam  After verbal consent patient was treated with HVLA and muscle energy techniques in cervical thoracic, lumbar, sacral and ilium areas  Patient tolerated the procedure well with improvement in symptoms  Patient given exercises, stretches and lifestyle modifications  See medications in patient instructions if given  Patient will follow up in 6 weeks

## 2014-10-29 NOTE — Telephone Encounter (Signed)
Lm on vm to cb and sch cpx

## 2014-11-05 ENCOUNTER — Encounter (HOSPITAL_BASED_OUTPATIENT_CLINIC_OR_DEPARTMENT_OTHER): Payer: Self-pay

## 2014-11-05 ENCOUNTER — Telehealth: Payer: Self-pay | Admitting: Pulmonary Disease

## 2014-11-05 NOTE — Telephone Encounter (Signed)
lmtcb x1 for pt. Advised her to ask for [facility]

## 2014-11-05 NOTE — Telephone Encounter (Signed)
Pt is retuning call for a [facility] [phone]

## 2014-11-06 NOTE — Telephone Encounter (Signed)
I was given patient's order to contact to arrange HST. Pt returned my call last week and informed me that she had already had her HST on 08/29/14 and was scheduled for CPAP Titration Study on 11/05/14, however, I see where this was cancelled, due to pt's insurance denying study. Rhonda J Cobb

## 2014-11-06 NOTE — Telephone Encounter (Signed)
Called and spoke with patient and she stated that she received the letter on Friday that her insurance had denied CPAP Titration Study. Pt is wondering on how to proceed. Pt stated that she has read about surgical procedures to correct some of these issues, oral appliance and of course, CPAP. Pt had the HST on 08/29/14 and has not seen Dr. Elsworth Soho to review these results to see if she would be a candidate. Pt may need an appointment to access. Please advise. Rhonda J Cobb

## 2014-11-06 NOTE — Telephone Encounter (Signed)
lmtcb Sally E Ottinger ° °

## 2014-11-07 NOTE — Telephone Encounter (Signed)
She had mild OSA -11/h OK for appt with TP to discuss options  Can refer to dentist if willing for oral appliance or can trial autoCPAP with nasal pillows

## 2014-11-07 NOTE — Telephone Encounter (Signed)
lmomtcb x1 

## 2014-11-08 NOTE — Telephone Encounter (Signed)
lmtcb x2 for pt. 

## 2014-11-11 NOTE — Telephone Encounter (Signed)
lmomtcb x 3  

## 2014-11-12 NOTE — Telephone Encounter (Signed)
Called and spoke to pt. Informed her of the recs per RA. Appt made with TP on 8.9.16. Pt verbalized understanding and denied any further questions or concerns at this time.

## 2014-12-09 ENCOUNTER — Ambulatory Visit (INDEPENDENT_AMBULATORY_CARE_PROVIDER_SITE_OTHER): Payer: 59 | Admitting: Family Medicine

## 2014-12-09 ENCOUNTER — Telehealth: Payer: Self-pay | Admitting: Family Medicine

## 2014-12-09 ENCOUNTER — Encounter: Payer: Self-pay | Admitting: Family Medicine

## 2014-12-09 VITALS — BP 124/74 | HR 84 | Ht 67.0 in | Wt 134.0 lb

## 2014-12-09 DIAGNOSIS — M999 Biomechanical lesion, unspecified: Secondary | ICD-10-CM

## 2014-12-09 DIAGNOSIS — M9904 Segmental and somatic dysfunction of sacral region: Secondary | ICD-10-CM | POA: Diagnosis not present

## 2014-12-09 DIAGNOSIS — M899 Disorder of bone, unspecified: Secondary | ICD-10-CM | POA: Diagnosis not present

## 2014-12-09 DIAGNOSIS — M549 Dorsalgia, unspecified: Secondary | ICD-10-CM

## 2014-12-09 DIAGNOSIS — M9903 Segmental and somatic dysfunction of lumbar region: Secondary | ICD-10-CM

## 2014-12-09 DIAGNOSIS — M9905 Segmental and somatic dysfunction of pelvic region: Secondary | ICD-10-CM

## 2014-12-09 MED ORDER — CYCLOBENZAPRINE HCL 5 MG PO TABS: 5.0000 mg | ORAL_TABLET | Freq: Three times a day (TID) | ORAL | Status: DC | PRN

## 2014-12-09 MED ORDER — CYCLOBENZAPRINE HCL 10 MG PO TABS: 10.0000 mg | ORAL_TABLET | Freq: Three times a day (TID) | ORAL | Status: DC | PRN

## 2014-12-09 NOTE — Assessment & Plan Note (Signed)
Decision today to treat with OMT was based on Physical Exam  After verbal consent patient was treated with HVLA and muscle energy techniques in cervical thoracic, lumbar, sacral and ilium areas  Patient tolerated the procedure well with improvement in symptoms  Patient given exercises, stretches and lifestyle modifications  See medications in patient instructions if given  Patient will follow up in 3-4 weeks

## 2014-12-09 NOTE — Telephone Encounter (Signed)
Patient was seen today and she was wondering if you can send her prescription to the [facility] on [address]

## 2014-12-09 NOTE — Assessment & Plan Note (Signed)
Significant improvement with the upper back. Patient is having more of a muscle spasm as well. We discussed icing regimen. Encouraged patient to stay active at this time. I do think that the muscle spasm is likely contributing to most of her discomfort today. Patient come back and see me again in 3-4 weeks.

## 2014-12-09 NOTE — Patient Instructions (Signed)
Good to see you Continue what you are doing your core is great. Flexeril at night if needed Fish oil 3 grams 3 times daily for 3 days, 2 grams daily for 2 days and then back to 2 grams daily thereafter for the mild concussion.  Call me if having trouble past Thursday.  Otherwise see me again in 3-4 weeks.
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
## 2014-12-09 NOTE — Progress Notes (Signed)
Pre visit review using our clinic review tool, if applicable. No additional management support is needed unless otherwise documented below in the visit note. 

## 2014-12-09 NOTE — Addendum Note (Signed)
Addended by: Montez Hageman on: 12/09/2014 11:07 AM   Modules accepted: Orders

## 2014-12-09 NOTE — Assessment & Plan Note (Signed)
Muscle spasm. Discussed  The possibility of a muscle relaxer patient was given a prescription. I think that patient likely will continue to improve slowly over the course of next several days. Patient does not significant improvement in 72 hours patient will call again and we will discuss further imaging studies if necessary.

## 2014-12-10 ENCOUNTER — Ambulatory Visit: Payer: Self-pay | Admitting: Adult Health

## 2014-12-11 ENCOUNTER — Telehealth: Payer: Self-pay | Admitting: Internal Medicine
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
## 2014-12-11 NOTE — Telephone Encounter (Signed)
Noted.  Home care advise given
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
## 2014-12-20 ENCOUNTER — Ambulatory Visit: Payer: Self-pay | Admitting: Adult Health

## 2014-12-23 NOTE — Telephone Encounter (Signed)
Pt declined cpx.  Pt wants fu on meds only at this time.

## 2014-12-26 ENCOUNTER — Ambulatory Visit: Payer: Self-pay | Admitting: Internal Medicine

## 2014-12-30 ENCOUNTER — Ambulatory Visit: Payer: 59 | Admitting: Family Medicine

## 2014-12-30 DIAGNOSIS — Z0289 Encounter for other administrative examinations: Secondary | ICD-10-CM

## 2015-01-05 ENCOUNTER — Other Ambulatory Visit: Payer: Self-pay | Admitting: Internal Medicine

## 2015-01-07 ENCOUNTER — Ambulatory Visit (INDEPENDENT_AMBULATORY_CARE_PROVIDER_SITE_OTHER): Payer: 59 | Admitting: Internal Medicine

## 2015-01-07 ENCOUNTER — Encounter: Payer: Self-pay | Admitting: Internal Medicine

## 2015-01-07 VITALS — BP 104/72 | Temp 98.6°F | Wt 139.7 lb

## 2015-01-07 DIAGNOSIS — Z23 Encounter for immunization: Secondary | ICD-10-CM

## 2015-01-07 DIAGNOSIS — R002 Palpitations: Secondary | ICD-10-CM

## 2015-01-07 MED ORDER — METOPROLOL TARTRATE 50 MG PO TABS: ORAL_TABLET | ORAL | Status: DC

## 2015-01-07 NOTE — Patient Instructions (Signed)
Continue lifestyle intervention healthy eating and exercise .  Refill med today  .  Get your colon cancer screening.  Recheck OV in a year
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
## 2015-01-08 NOTE — Telephone Encounter (Signed)
Sent to the pharmacy by e-scribe. 

## 2015-01-20 ENCOUNTER — Ambulatory Visit (INDEPENDENT_AMBULATORY_CARE_PROVIDER_SITE_OTHER): Payer: 59 | Admitting: Family Medicine

## 2015-01-20 ENCOUNTER — Encounter: Payer: Self-pay | Admitting: Family Medicine

## 2015-01-20 VITALS — BP 98/62 | HR 57 | Ht 67.0 in | Wt 139.0 lb

## 2015-01-20 DIAGNOSIS — M533 Sacrococcygeal disorders, not elsewhere classified: Secondary | ICD-10-CM

## 2015-01-20 DIAGNOSIS — M9905 Segmental and somatic dysfunction of pelvic region: Secondary | ICD-10-CM | POA: Diagnosis not present

## 2015-01-20 DIAGNOSIS — M9904 Segmental and somatic dysfunction of sacral region: Secondary | ICD-10-CM

## 2015-01-20 DIAGNOSIS — M999 Biomechanical lesion, unspecified: Secondary | ICD-10-CM

## 2015-01-20 DIAGNOSIS — M9903 Segmental and somatic dysfunction of lumbar region: Secondary | ICD-10-CM | POA: Diagnosis not present

## 2015-01-20 DIAGNOSIS — M9902 Segmental and somatic dysfunction of thoracic region: Secondary | ICD-10-CM

## 2015-01-20 NOTE — Assessment & Plan Note (Signed)
Decision today to treat with OMT was based on Physical Exam  After verbal consent patient was treated with HVLA and muscle energy techniques in cervical thoracic, lumbar, sacral and ilium areas  Patient tolerated the procedure well with improvement in symptoms  Patient given exercises, stretches and lifestyle modifications  See medications in patient instructions if given  Patient will follow up in 4-6 weeks             

## 2015-01-20 NOTE — Assessment & Plan Note (Signed)
I do believe the patient does have more of a sacroiliac joint dysfunction. We discussed icing regimen and home exercises. Patient continue with the same medications that she is taking at this time. Patient's will come remain active and continuing to focus on hip abductor's. We discussed different things and core strengthening medical with beneficial. Patient and will follow-up with me again in 4-6 weeks for further evaluation and treatment.
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
## 2015-01-20 NOTE — Patient Instructions (Signed)
Good to see you Ice is your friend Take it  A little easy today Hip abductors still are our focus Tennis ball in back left pocket with sitting a long mount of time.  See me againin 4-6 weeks.

## 2015-01-20 NOTE — Progress Notes (Signed)
Pre visit review using our clinic review tool, if applicable. No additional management support is needed unless otherwise documented below in the visit note. 

## 2015-01-22 ENCOUNTER — Ambulatory Visit (INDEPENDENT_AMBULATORY_CARE_PROVIDER_SITE_OTHER): Payer: 59 | Admitting: Adult Health

## 2015-01-22 ENCOUNTER — Encounter: Payer: Self-pay | Admitting: Adult Health

## 2015-01-22 VITALS — BP 122/64 | HR 79 | Temp 97.7°F | Ht 66.0 in | Wt 138.0 lb

## 2015-01-22 DIAGNOSIS — G4733 Obstructive sleep apnea (adult) (pediatric): Secondary | ICD-10-CM

## 2015-01-22 NOTE — Patient Instructions (Signed)
Begin CPAP with nasal pillows .  Goal is to wear at least 6hrs .  Do not drive if sleepy.  Download in 1 month .  Follow up Dr. Elsworth Soho  In 2 months and As needed

## 2015-01-23 DIAGNOSIS — G4733 Obstructive sleep apnea (adult) (pediatric): Secondary | ICD-10-CM | POA: Insufficient documentation

## 2015-01-23 NOTE — Progress Notes (Signed)
   Subjective:    Patient ID: Andrea Ortiz, female    DOB: 1960-07-08, 54 y.o.   MRN: [id number]  HPI 54 yo female seen for sleep consult 07/16/14 for sleep issues with snoring and gasping episodes during sleep .   01/22/15  Follow up : OSA Rachelle Hora Study Results Pt returns for follow up to review sleep study results.  Pt underwent HST on 08/29/14 that showed mild OSA with AHI 11 /h  We discussed her results. She does feel tired during daytime.  Has loud snoring and gasping during sleep . Sleeps in different bedroom  Due to snoring. Previously on trazodone during alcohol rehab which seemed to  Worsen symptoms. She has stopped this but snoring persists. She has snored loudly for years.  She was recommended for CPAP titration study but insurance denied this.  We discussed treatment options with oral appliance and CPAP .  She is interested in surgical intervention that she read about ? Uvulopalatopharyngoplasty. Reviewed that this is a invasive /surgical procedure that can help but does not guarantee resolution of OSA. , we can refer to ENT in future as requested.  Would like to see if CPAP /noninvasive would work for primary treatment .  She is okay with proceeding with CPAP for now.  She denies chest pain, orthopnea, edema or fever.     Review of Systems Constitutional:   No  weight loss, night sweats,  Fevers, chills,  +fatigue, or  Lassitude. +snoring   HEENT:   No headaches,  Difficulty swallowing,  Tooth/dental problems, or  Sore throat,                No sneezing, itching, ear ache, nasal congestion, post nasal drip,   CV:  No chest pain,  Orthopnea, PND, swelling in lower extremities, anasarca, dizziness, palpitations, syncope.   GI  No heartburn, indigestion, abdominal pain, nausea, vomiting, diarrhea, change in bowel habits, loss of appetite, bloody stools.   Resp: No shortness of breath with exertion or at rest.  No excess mucus, no productive cough,  No non-productive  cough,  No coughing up of blood.  No change in color of mucus.  No wheezing.  No chest wall deformity  Skin: no rash or lesions.  GU: no dysuria, change in color of urine, no urgency or frequency.  No flank pain, no hematuria   MS:  No joint pain or swelling.  No decreased range of motion.  No back pain.  Psych:    No memory loss.          Objective:   Physical Exam GEN: A/Ox3; pleasant , NAD, well nourished   HEENT:  [place]/AT,  EACs-clear, TMs-wnl, NOSE-clear, THROAT-clear, no lesions, no postnasal drip or exudate noted. Class 1-2 airway with elongated uvula , no tonsillar hypertrophy noted.   NECK:  Supple w/ fair ROM; no JVD; normal carotid impulses w/o bruits; no thyromegaly or nodules palpated; no lymphadenopathy.  RESP  Clear  P & A; w/o, wheezes/ rales/ or rhonchi.no accessory muscle use, no dullness to percussion  CARD:  RRR, no m/r/g  , no peripheral edema, pulses intact, no cyanosis or clubbing.  GI:   Soft & nt; nml bowel sounds; no organomegaly or masses detected.  Musco: Warm bil, no deformities or joint swelling noted.   Neuro: alert, no focal deficits noted.    Skin: Warm, no lesions or rashes         Assessment & Plan:

## 2015-01-23 NOTE — Progress Notes (Signed)
Reviewed & agree with plan  

## 2015-01-23 NOTE — Assessment & Plan Note (Signed)
HST reviewed personally Discussed in detail with pt with tx options including oral appliance and CPAP  Can discuss surgical intervention as narrow pharyngeal exam with elongated uvula in future if CPAP intolerant   Plan  Begin CPAP with nasal pillows .  Goal is to wear at least 6hrs .  Do not drive if sleepy.  Download in 1 month .  Follow up Dr. Elsworth Soho  In 2 months and As needed

## 2015-01-27 NOTE — Progress Notes (Signed)
Reviewed & agree with plan  

## 2015-02-24 ENCOUNTER — Ambulatory Visit: Payer: Self-pay | Admitting: Family Medicine

## 2015-02-26 ENCOUNTER — Ambulatory Visit (INDEPENDENT_AMBULATORY_CARE_PROVIDER_SITE_OTHER): Payer: 59 | Admitting: Family Medicine

## 2015-02-26 ENCOUNTER — Encounter: Payer: Self-pay | Admitting: Family Medicine

## 2015-02-26 VITALS — BP 104/68 | HR 62 | Ht 66.0 in | Wt 139.0 lb

## 2015-02-26 DIAGNOSIS — M533 Sacrococcygeal disorders, not elsewhere classified: Secondary | ICD-10-CM | POA: Diagnosis not present

## 2015-02-26 DIAGNOSIS — M9904 Segmental and somatic dysfunction of sacral region: Secondary | ICD-10-CM

## 2015-02-26 DIAGNOSIS — M9903 Segmental and somatic dysfunction of lumbar region: Secondary | ICD-10-CM

## 2015-02-26 DIAGNOSIS — M9902 Segmental and somatic dysfunction of thoracic region: Secondary | ICD-10-CM

## 2015-02-26 DIAGNOSIS — M9905 Segmental and somatic dysfunction of pelvic region: Secondary | ICD-10-CM | POA: Diagnosis not present

## 2015-02-26 DIAGNOSIS — M999 Biomechanical lesion, unspecified: Secondary | ICD-10-CM

## 2015-02-26 DIAGNOSIS — S76311D Strain of muscle, fascia and tendon of the posterior muscle group at thigh level, right thigh, subsequent encounter: Secondary | ICD-10-CM
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
## 2015-02-26 NOTE — Assessment & Plan Note (Signed)
Encourage patient to continue to work on core strength but she has done very well with. Encourage her to stay active. Patient was to workout will continue to do so. We did discuss the importance of recovery. Patient has anti-inflammatories for breakthrough pain. Patient also has gabapentin but has not taken it in a long time. Patient will come back and see me again in 3-4 weeks for further evaluation and treatment.

## 2015-02-26 NOTE — Assessment & Plan Note (Signed)
Decision today to treat with OMT was based on Physical Exam  After verbal consent patient was treated with HVLA and muscle energy techniques in cervical thoracic, lumbar, sacral and ilium areas  Patient tolerated the procedure well with improvement in symptoms  Patient given exercises, stretches and lifestyle modifications  See medications in patient instructions if given  Patient will follow up in 3-4 weeks                 

## 2015-02-26 NOTE — Progress Notes (Signed)
Pre visit review using our clinic review tool, if applicable. No additional management support is needed unless otherwise documented below in the visit note. 

## 2015-02-26 NOTE — Assessment & Plan Note (Addendum)
Discussed with patient about home exercises. Patient given new exercises that I think with beneficial. We discussed regimen As well as compression sleeve. Patient will try these and come back and see me again in 3-4 weeks. If continuing have trouble she may have more of an ischial bursitis and may need to consider possible imaging and intervention.

## 2015-02-26 NOTE — Patient Instructions (Addendum)
Great to see you Compression sleeve to the thigh with activity Shorten your stride with running New exercises for the hamstring Continue the vitamins See me again 3-4 weeks.

## 2015-03-19 ENCOUNTER — Ambulatory Visit (INDEPENDENT_AMBULATORY_CARE_PROVIDER_SITE_OTHER): Payer: 59 | Admitting: Family Medicine

## 2015-03-19 ENCOUNTER — Encounter: Payer: Self-pay | Admitting: Family Medicine

## 2015-03-19 VITALS — BP 118/70 | HR 78 | Ht 66.0 in | Wt 140.0 lb

## 2015-03-19 DIAGNOSIS — M9904 Segmental and somatic dysfunction of sacral region: Secondary | ICD-10-CM

## 2015-03-19 DIAGNOSIS — M9905 Segmental and somatic dysfunction of pelvic region: Secondary | ICD-10-CM

## 2015-03-19 DIAGNOSIS — M999 Biomechanical lesion, unspecified: Secondary | ICD-10-CM

## 2015-03-19 DIAGNOSIS — M9903 Segmental and somatic dysfunction of lumbar region: Secondary | ICD-10-CM | POA: Diagnosis not present

## 2015-03-19 DIAGNOSIS — M533 Sacrococcygeal disorders, not elsewhere classified: Secondary | ICD-10-CM | POA: Diagnosis not present

## 2015-03-19 NOTE — Assessment & Plan Note (Signed)
Patient does have securely asked dysfunction. Has responded very well to osteopathic manipulation. Patient had significant less tightness and she's had previously. Mild tightness of the hip flexors. We discussed different exercises that I think would be beneficial and showed proper technique. Patient will be spaced out again to 4-6 week intervals. Tinel's and medications.

## 2015-03-19 NOTE — Progress Notes (Signed)
Pre visit review using our clinic review tool, if applicable. No additional management support is needed unless otherwise documented below in the visit note. 

## 2015-03-19 NOTE — Assessment & Plan Note (Signed)
Decision today to treat with OMT was based on Physical Exam  After verbal consent patient was treated with HVLA and muscle energy techniques in cervical thoracic, lumbar, sacral and ilium areas  Patient tolerated the procedure well with improvement in symptoms  Patient given exercises, stretches and lifestyle modifications  See medications in patient instructions if given  Patient will follow up in 4-6 weeks             

## 2015-03-19 NOTE — Progress Notes (Signed)
  [name] [place] [place], [place] [address] Phone: [phone] Subjective:     CC: Back pain follow up  [id number] Andrea Ortiz is a 54 y.o. female coming in with complaint of back pain. Patient does have chronic back pain as well as sacroiliac joint dysfunction patient has been doing very well. As long she does the stretches on a regular basis she does not have any significant pain. No radicular symptoms and states that the tightness in the hamstrings has improved significant. No new symptoms.   PMHX: MRI shows patient at that time did have lumbar facet arthropathy with degenerative disc disease mostly at the L4-L5 area.  February 23rd 2015 lumbar and thoracic x-rays were taken and did show multilevel degenerative changes mostly of the facet arthropathy.   Past medical history, social, surgical and family history all reviewed in electronic medical record.   Review of Systems: No headache, visual changes, nausea, vomiting, diarrhea, constipation, dizziness, abdominal pain, skin rash, fevers, chills, night sweats, weight loss, swollen lymph nodes, body aches, joint swelling, muscle aches, chest pain, shortness of breath, mood changes.   Objective Blood pressure 118/70, pulse 78, height 5\' 6"  (1.676 m), weight 140 lb (63.504 kg), SpO2 97 %.  General: No apparent distress alert and oriented x3 mood and affect normal, dressed appropriately.  HEENT: Pupils equal, extraocular movements intact  Respiratory: Patient's speak in full sentences and does not appear short of breath  Cardiovascular: No lower extremity edema, non tender, no erythema  Skin: Warm dry intact with no signs of infection or rash on extremities or on axial skeleton.  Abdomen: Soft nontender  Neuro: Cranial nerves II through XII are intact, neurovascularly intact in all extremities with 2+ DTRs and 2+ pulses.  Lymph: No lymphadenopathy of posterior or anterior cervical chain or  axillae bilaterally.  Gait normal with good balance and coordination.  MSK:  Non tender with full range of motion and good stability and symmetric strength and tone of shoulders, elbows, wrist, hip, knee and ankles bilaterally.  Back Exam:  Inspection: Mild dextroscoliosis of the lumbar spine  Motion: Flexion 45 deg, Extension 45 deg, Side Bending to 45 deg bilaterally,  Rotation to 45 deg bilaterally  SLR laying: Negative  XSLR laying: Negative  Palpable tenderness: Mild increase in stiffness of the hip flexor on the right side FABER: Negative which is an improvement Sensory change: Gross sensation intact to all lumbar and sacral dermatomes.  Reflexes: 2+ at both patellar tendons, 2+ at achilles tendons, Babinski's downgoing.  Strength at foot  Plantar-flexion: 5/5 Dorsi-flexion: 5/5 Eversion: 5/5 Inversion: 5/5  Leg strength  Quad: 5/5 Hamstring: 5/5 Hip flexor: 5/5 Hip abductors: 5/5  Gait unremarkable. ..   OMT Physical Exam  Cervical  Neutral  Thoracic T5 extended rotated inside that right T3 extended rotated and side bent left with inhaled rib  Lumbar L2 flexed rotated inside that right L4 flexed rotated and side bent left  Sacrum Left on left  Anterior ilium right      Impression and Recommendations:     This case required medical decision making of moderate complexity.

## 2015-03-19 NOTE — Patient Instructions (Addendum)
Good to see you Ice is your friend Stay active and continue the core strength You are doing well continue to work you butt off.  New hip flexor exercise after workout Enjoy [place].  See me again in 4 weeks.

## 2015-03-20 ENCOUNTER — Telehealth: Payer: Self-pay | Admitting: Pulmonary Disease

## 2015-03-20 NOTE — Telephone Encounter (Signed)
Patient says that she is returning the CPAP machine, she says it is not working for her She says that she hasn't decided what to do yet, she is leaning toward dental appliance, but not sure, she says that she will call our office when she has decided what she wants to do to treat her sleep apnea.  FYI to Dr. Elsworth Soho

## 2015-03-20 NOTE — Telephone Encounter (Signed)
Compliance Report - 03/05/15  Per Dr. Elsworth Soho: CPAP effective @ 8cm Poor usage Change to 8cm Recheck download in 1 month Follow up in 1 month

## 2015-03-21 NOTE — Telephone Encounter (Signed)
ok 

## 2015-03-26 ENCOUNTER — Encounter: Payer: Self-pay | Admitting: Pulmonary Disease

## 2015-04-17 ENCOUNTER — Ambulatory Visit (INDEPENDENT_AMBULATORY_CARE_PROVIDER_SITE_OTHER): Payer: 59 | Admitting: Family Medicine

## 2015-04-17 ENCOUNTER — Encounter: Payer: Self-pay | Admitting: Family Medicine

## 2015-04-17 VITALS — BP 110/68 | HR 74 | Ht 66.0 in | Wt 141.0 lb

## 2015-04-17 DIAGNOSIS — M9904 Segmental and somatic dysfunction of sacral region: Secondary | ICD-10-CM

## 2015-04-17 DIAGNOSIS — M9905 Segmental and somatic dysfunction of pelvic region: Secondary | ICD-10-CM

## 2015-04-17 DIAGNOSIS — M9903 Segmental and somatic dysfunction of lumbar region: Secondary | ICD-10-CM | POA: Diagnosis not present

## 2015-04-17 DIAGNOSIS — M533 Sacrococcygeal disorders, not elsewhere classified: Secondary | ICD-10-CM | POA: Diagnosis not present

## 2015-04-17 DIAGNOSIS — M999 Biomechanical lesion, unspecified: Secondary | ICD-10-CM

## 2015-04-17 NOTE — Progress Notes (Signed)
  [name] [address] [place], [place] [address] Phone: [phone] Subjective:     CC: Back pain follow up  [id number] Andrea Ortiz is a 54 y.o. female coming in with complaint of back pain. Patient does have chronic back pain as well as sacroiliac joint dysfunction.  Has seen patient over multiple months. Has been responding very well to osteopathic manipulation. Patient has had some tight hip flexors. Patient states overall doing well. Continues to work out most days a week. Not having any exacerbations at this time. No radicular symptoms. Patient is happy with the results. Does notice when working long hours patient's has poor posture and notices more discomfort. No side effects to any medications.   PMHX: MRI shows patient at that time did have lumbar facet arthropathy with degenerative disc disease mostly at the L4-L5 area.  February 23rd 2015 lumbar and thoracic x-rays were taken and did show multilevel degenerative changes mostly of the facet arthropathy.   Past medical history, social, surgical and family history all reviewed in electronic medical record.   Review of Systems: No headache, visual changes, nausea, vomiting, diarrhea, constipation, dizziness, abdominal pain, skin rash, fevers, chills, night sweats, weight loss, swollen lymph nodes, body aches, joint swelling, muscle aches, chest pain, shortness of breath, mood changes.   Objective Blood pressure 110/68, pulse 74, height 5\' 6"  (1.676 m), weight 141 lb (63.957 kg), SpO2 92 %.  General: No apparent distress alert and oriented x3 mood and affect normal, dressed appropriately.  HEENT: Pupils equal, extraocular movements intact  Respiratory: Patient's speak in full sentences and does not appear short of breath  Cardiovascular: No lower extremity edema, non tender, no erythema  Skin: Warm dry intact with no signs of infection or rash on extremities or on axial skeleton.  Abdomen:  Soft nontender  Neuro: Cranial nerves II through XII are intact, neurovascularly intact in all extremities with 2+ DTRs and 2+ pulses.  Lymph: No lymphadenopathy of posterior or anterior cervical chain or axillae bilaterally.  Gait normal with good balance and coordination.  MSK:  Non tender with full range of motion and good stability and symmetric strength and tone of shoulders, elbows, wrist, hip, knee and ankles bilaterally.  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 Motion: Flexion 45 deg, Extension 45 deg, Side Bending to 45 deg bilaterally,  Rotation to 45 deg bilaterally  SLR laying: Negative  XSLR laying: Negative  Palpable tenderness: Continued stiffness of the hip flexors FABER: Negative  Sensory change: Gross sensation intact to all lumbar and sacral dermatomes.  Reflexes: 2+ at both patellar tendons, 2+ at achilles tendons, Babinski's downgoing.  Strength at foot  Plantar-flexion: 5/5 Dorsi-flexion: 5/5 Eversion: 5/5 Inversion: 5/5  Leg strength  Quad: 5/5 Hamstring: 5/5 Hip flexor: 5/5 Hip abductors: 5/5  Gait unremarkable. ..   OMT Physical Exam  Cervical  C2 flexed rotated and side bent right  Thoracic T3 extended rotated and side bent left with inhaled rib T8 extended rotated and side bent right  Lumbar L2 flexed rotated inside that right L4 flexed rotated and side bent left  Sacrum Left on left  Ileum neutral     Impression and Recommendations:     This case required medical decision making of moderate complexity.

## 2015-04-17 NOTE — Patient Instructions (Signed)
Happy holidays!  Great to see you  Continue to work on the hip flexors, these will be key.  Posture  Have a great time Give your husband his boots back See me again in 6-8 weeks.

## 2015-04-17 NOTE — Progress Notes (Signed)
Pre visit review using our clinic review tool, if applicable. No additional management support is needed unless otherwise documented below in the visit note. 

## 2015-04-17 NOTE — Assessment & Plan Note (Signed)
Patient overall is doing significantly better. Continue to work on hip abductor and core strengthening. We discussed proper shoes. Patient was not wearing those today. Patient's will also work on some gluteal exercises. We will like to see patient back again in 6-8 weeks for further evaluation and treatment.

## 2015-04-17 NOTE — Assessment & Plan Note (Signed)
Decision today to treat with OMT was based on Physical Exam  After verbal consent patient was treated with HVLA and muscle energy techniques in cervical thoracic, lumbar, sacral and ilium areas  Patient tolerated the procedure well with improvement in symptoms  Patient given exercises, stretches and lifestyle modifications  See medications in patient instructions if given  Patient will follow up in 6-8 weeks

## 2015-05-29 ENCOUNTER — Ambulatory Visit (INDEPENDENT_AMBULATORY_CARE_PROVIDER_SITE_OTHER): Payer: 59 | Admitting: Family Medicine

## 2015-05-29 ENCOUNTER — Encounter: Payer: Self-pay | Admitting: Family Medicine

## 2015-05-29 VITALS — BP 116/74 | HR 74 | Wt 147.0 lb

## 2015-05-29 DIAGNOSIS — M9905 Segmental and somatic dysfunction of pelvic region: Secondary | ICD-10-CM | POA: Diagnosis not present

## 2015-05-29 DIAGNOSIS — M9902 Segmental and somatic dysfunction of thoracic region: Secondary | ICD-10-CM

## 2015-05-29 DIAGNOSIS — M9904 Segmental and somatic dysfunction of sacral region: Secondary | ICD-10-CM

## 2015-05-29 DIAGNOSIS — M533 Sacrococcygeal disorders, not elsewhere classified: Secondary | ICD-10-CM

## 2015-05-29 DIAGNOSIS — M5010 Cervical disc disorder with radiculopathy, unspecified cervical region: Secondary | ICD-10-CM

## 2015-05-29 DIAGNOSIS — M999 Biomechanical lesion, unspecified: Secondary | ICD-10-CM

## 2015-05-29 DIAGNOSIS — M9903 Segmental and somatic dysfunction of lumbar region: Secondary | ICD-10-CM | POA: Diagnosis not present

## 2015-05-29 MED ORDER — TIZANIDINE HCL 4 MG PO TABS: 4.0000 mg | ORAL_TABLET | Freq: Three times a day (TID) | ORAL | Status: DC | PRN

## 2015-05-29 MED ORDER — PREDNISONE 50 MG PO TABS: 50.0000 mg | ORAL_TABLET | Freq: Every day | ORAL | Status: DC

## 2015-05-29 NOTE — Assessment & Plan Note (Signed)
Patient is having radicular symptoms. Going down the C7 nerve distribution on the left side. Limited range of motion of the neck. This is all new finding since previous exam. 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 Patient will come back after taking the scheduled prednisone as well as muscle relaxer in one week and we'll make sure the patient is responding. Otherwise imaging will be needed. Discussed red flags and went to seek medical attention.

## 2015-05-29 NOTE — Patient Instructions (Signed)
God to see you Prednisone daily for 5 days  Zanaflex up to 3 times daily Continue the gabapentin  Ice 20 minutes 2 times daily. Usually after activity and before bed. See me again in 1-2 weeks to make sure we are better or call if worsening.  Wrist brace day and night for 1 week.  (sorry do not have one here can get at [facility]).

## 2015-05-29 NOTE — Assessment & Plan Note (Signed)
Overall patient is doing better. We discussed icing regimen and home exercises. We discussed what activities to do an which ones to avoid. Core stabilization will be key. Overall though patient has been doing well with this.

## 2015-05-29 NOTE — Progress Notes (Signed)
[name] [address] [place], [place] [address] Phone: [phone] Subjective:     CC: Back pain follow up  [id number] Andrea Ortiz is a 55 y.o. female coming in with complaint of back pain. Patient does have chronic back pain as well as sacroiliac joint dysfunction.  Has seen patient over multiple months. Has been responding very well to osteopathic manipulation. States that overall she seems to be doing relatively well.  Patient is complaining of new prone. Neck pain. Has had known arthritis she states. States that it seems to be radiating down her arm. Been going on for proximal wing 4 weeks. Going to the middle finger. No weakness but can cause significant soreness. States that if she moves her head in a certain range of motion and it causes severe pain going down the arm with an electrical shock feeling. Has what could her up at night.   PMHX: MRI shows patient at that time did have lumbar facet arthropathy with degenerative disc disease mostly at the L4-L5 area.  February 23rd 2015 lumbar and thoracic x-rays were taken and did show multilevel degenerative changes mostly of the facet arthropathy.  we do not have any imaging of the neck.  Past Medical History  Diagnosis Date  . 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 PSVT   . EXANTHEM   . PALPITATIONS, RECURRENT   . Chest pain 02/09/2011  . 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 Rhinoplasty  1980   Social History  Substance Use Topics  . Smoking status: Former Smoker -- 1.50 packs/day for 15 years    Types: Cigarettes    Quit date: 08/12/1995  . Smokeless tobacco: Never Used  . Alcohol Use: 3.0 oz/week    5 Glasses of wine per week   Allergies  Allergen Reactions  . Escitalopram Oxalate     REACTION: hand s swelled up.   Family History  Problem Relation Age of Onset  . Alzheimer's disease  Mother   . Heart attack Mother     age 65's  . Parkinsonism Father   . Other Sister     Hypoglycemia    Past medical history, social, surgical and family history all reviewed in electronic medical record.   Review of Systems: No headache, visual changes, nausea, vomiting, diarrhea, constipation, dizziness, abdominal pain, skin rash, fevers, chills, night sweats, weight loss, swollen lymph nodes, body aches, joint swelling, muscle aches, chest pain, shortness of breath, mood changes.   Objective Blood pressure 116/74, pulse 74, weight 147 lb (66.679 kg).  General: No apparent distress alert and oriented x3 mood and affect normal, dressed appropriately.  HEENT: Pupils equal, extraocular movements intact  Respiratory: Patient's speak in full sentences and does not appear short of breath  Cardiovascular: No lower extremity edema, non tender, no erythema  Skin: Warm dry intact with no signs of infection or rash on extremities or on axial skeleton.  Abdomen: Soft nontender  Neuro: Cranial nerves II through XII are intact, neurovascularly intact in all extremities with 2+ DTRs and 2+ pulses.  Lymph: No lymphadenopathy of posterior or anterior cervical chain or axillae bilaterally.  Gait normal with good balance and coordination.  MSK:  Non tender with full range of motion and good stability and symmetric strength and tone of shoulders, elbows, wrist, hip, knee and ankles bilaterally.  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 Motion: Flexion 45 deg, Extension 45 deg, Side Bending to 45 deg bilaterally,  Rotation to 45 deg bilaterally  SLR laying: Negative  XSLR laying: Negative  Palpable tenderness: Mild improvement of the hip flexors FABER: Negative  Sensory change: Gross sensation intact to all lumbar and sacral dermatomes.  Reflexes: 2+ at both  patellar tendons, 2+ at achilles tendons, Babinski's downgoing.  Strength at foot  Plantar-flexion: 5/5 Dorsi-flexion: 5/5 Eversion: 5/5 Inversion: 5/5  Leg strength  Quad: 5/5 Hamstring: 5/5 Hip flexor: 5/5 Hip abductors: 5/5  Gait unremarkable. Marland Kitchen.Neck: Inspection unremarkable. No palpable stepoffs. Positive Spurling's maneuver with radicular symptoms of the middle finger Device last 10 of extension as well as 10 to side bending to the left. Grip strength and sensation normal in bilateral hands Strength good C4 to T1 distribution No sensory change to C4 to T1 Negative Hoffman sign bilaterally Reflexes normal   OMT Physical Exam  Thoracic T3 extended rotated and side bent left with inhaled rib T8 extended rotated and side bent right  Lumbar L2 flexed rotated inside that right L4 flexed rotated and side bent left  Sacrum Left on left  Ileum neutral     Impression and Recommendations:     This case required medical decision making of moderate complexity.

## 2015-05-29 NOTE — Assessment & Plan Note (Signed)
Decision today to treat with OMT was based on Physical Exam  After verbal consent patient was treated with HVLA and muscle energy techniques in thoracic, lumbar, sacral and ilium areas  Patient tolerated the procedure well with improvement in symptoms  Patient given exercises, stretches and lifestyle modifications  See medications in patient instructions if given  Patient will follow up in 6-8 weeks

## 2015-06-11 ENCOUNTER — Ambulatory Visit (INDEPENDENT_AMBULATORY_CARE_PROVIDER_SITE_OTHER): Payer: 59 | Admitting: Family Medicine

## 2015-06-11 ENCOUNTER — Ambulatory Visit (INDEPENDENT_AMBULATORY_CARE_PROVIDER_SITE_OTHER)
Admission: RE | Admit: 2015-06-11 | Discharge: 2015-06-11 | Disposition: A | Discharge status: Home / Self Care | Payer: 59 | Source: Ambulatory Visit | Attending: Family Medicine | Admitting: Family Medicine

## 2015-06-11 ENCOUNTER — Encounter: Payer: Self-pay | Admitting: Family Medicine

## 2015-06-11 VITALS — BP 106/72 | HR 63 | Ht 66.0 in | Wt 141.0 lb

## 2015-06-11 DIAGNOSIS — M542 Cervicalgia: Secondary | ICD-10-CM | POA: Diagnosis not present

## 2015-06-11 DIAGNOSIS — M533 Sacrococcygeal disorders, not elsewhere classified: Secondary | ICD-10-CM | POA: Diagnosis not present

## 2015-06-11 DIAGNOSIS — M9905 Segmental and somatic dysfunction of pelvic region: Secondary | ICD-10-CM | POA: Diagnosis not present

## 2015-06-11 DIAGNOSIS — M9904 Segmental and somatic dysfunction of sacral region: Secondary | ICD-10-CM | POA: Diagnosis not present

## 2015-06-11 DIAGNOSIS — M9903 Segmental and somatic dysfunction of lumbar region: Secondary | ICD-10-CM

## 2015-06-11 DIAGNOSIS — M5010 Cervical disc disorder with radiculopathy, unspecified cervical region: Secondary | ICD-10-CM

## 2015-06-11 DIAGNOSIS — M999 Biomechanical lesion, unspecified: Secondary | ICD-10-CM

## 2015-06-11 MED ORDER — TRAMADOL HCL 50 MG PO TABS: 50.0000 mg | ORAL_TABLET | Freq: Two times a day (BID) | ORAL | Status: DC | PRN

## 2015-06-11 NOTE — Assessment & Plan Note (Signed)
Patient overall is doing much better. Mild increasing tightness today. Nothing that seems to be causing me to make any change in medical management. Patient come back and see me again in 3 weeks.

## 2015-06-11 NOTE — Progress Notes (Signed)
Pre visit review using our clinic review tool, if applicable. No additional management support is needed unless otherwise documented below in the visit note. 

## 2015-06-11 NOTE — Assessment & Plan Note (Signed)
Concerned with patients cervical radicular the again. I do think at this point we do need neck x-rays for further evaluation. Patient does not have any weakness but patient does have a positive Spurling's. Possibly advance imaging will be warranted if patient does not make improvement in the next week. We discussed icing regimen. Patient will continue with the conservative therapies otherwise. Patient given up her scheduled for tramadol today for any breakthrough pain. Patient is an alcoholic and was concerned about the medication. We discussed at this time she will call pain that we do have a good relationship.

## 2015-06-11 NOTE — Patient Instructions (Addendum)
It is good to see you  Ice when you need it Continue the medications We will try a low dose of tramadol and take with one tylenol if you really need it.  Xray of neck today I will tell you what it means in my chart Send me a message in 1 week because if not better we will need to consider MRI.  See me again in 3 weeks.

## 2015-06-11 NOTE — Assessment & Plan Note (Signed)
Decision today to treat with OMT was based on Physical Exam  After verbal consent patient was treated with HVLA and muscle energy techniques in thoracic, lumbar, sacral and ilium areas  Patient tolerated the procedure well with improvement in symptoms  Patient given exercises, stretches and lifestyle modifications  See medications in patient instructions if given  Patient will follow up in 3 weeks

## 2015-06-11 NOTE — Progress Notes (Signed)
[facility] [address] [place], [place] [address] Phone: [phone] Subjective:     CC: Back pain follow up  [id number] Andrea Ortiz is a 55 y.o. female coming in with complaint of back pain. Patient does have chronic back pain as well as sacroiliac joint dysfunction.  States overall her lower back seems to be fairly stable. Still gives her some mild discomfort. Able to workout on a regular basis though. No radiation down the leg. Seems to be stable overall. Patient though now she's been compensating for neck and has noticed some mild increase in back stiffness secondary to her doing more activities for the lower back and legs.  Patient is complaining of neck pain. Patient states that she's had known arthritic changes in the neck. Was having a positive Spurling's at last visit. Patient was given prednisone as well as gabapentin. Patient consented home exercises. Patient states he continues to have significant pain mostly on the left sign. Radicular symptom still present. Hernia her at her elbow as well. Has been wearing a wrist brace to see if this will be helpful. She was on the prednisone which may no significant improvement.   PMHX: MRI shows patient at that time did have lumbar facet arthropathy with degenerative disc disease mostly at the L4-L5 area.  February 23rd 2015 lumbar and thoracic x-rays were taken and did show multilevel degenerative changes mostly of the facet arthropathy.    Past Medical History  Diagnosis Date  . 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 PSVT   . EXANTHEM   . PALPITATIONS, RECURRENT   . Chest pain 02/09/2011  . 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 Rhinoplasty  1980   Social History  Substance Use Topics  . Smoking status: Former Smoker -- 1.50 packs/day for 15 years    Types: Cigarettes    Quit date: 08/12/1995  . Smokeless  tobacco: Never Used  . Alcohol Use: 3.0 oz/week    5 Glasses of wine per week   Allergies  Allergen Reactions  . Escitalopram Oxalate     REACTION: hand s swelled up.   Family History  Problem Relation Age of Onset  . Alzheimer's disease Mother   . Heart attack Mother     age 80's  . Parkinsonism Father   . Other Sister     Hypoglycemia    Past medical history, social, surgical and family history all reviewed in electronic medical record.   Review of Systems: No headache, visual changes, nausea, vomiting, diarrhea, constipation, dizziness, abdominal pain, skin rash, fevers, chills, night sweats, weight loss, swollen lymph nodes, body aches, joint swelling, muscle aches, chest pain, shortness of breath, mood changes.   Objective Blood pressure 106/72, pulse 63, height 5\' 6"  (1.676 m), weight 141 lb (63.957 kg).  General: No apparent distress alert and oriented x3 mood and affect normal, dressed appropriately.  HEENT: Pupils equal, extraocular movements intact  Respiratory: Patient's speak in full sentences and does not appear short of breath  Cardiovascular: No lower extremity edema, non tender, no erythema  Skin: Warm dry intact with no signs of infection or rash on extremities or on axial skeleton.  Abdomen: Soft nontender  Neuro: Cranial nerves II through XII are intact, neurovascularly intact in all extremities with 2+ DTRs and 2+ pulses.  Lymph: No lymphadenopathy of posterior or anterior cervical  chain or axillae bilaterally.  Gait normal with good balance and coordination.  MSK:  Non tender with full range of motion and good stability and symmetric strength and tone of shoulders, elbows, wrist, hip, knee and ankles bilaterally.  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 Motion: Flexion 45 deg, Extension 45 deg, Side Bending to 45  deg bilaterally,  Rotation to 45 deg bilaterally  SLR laying: Negative  XSLR laying: Negative  Palpable tenderness: Mild improvement of the hip flexors FABER: Negative  Sensory change: Gross sensation intact to all lumbar and sacral dermatomes.  Reflexes: 2+ at both patellar tendons, 2+ at achilles tendons, Babinski's downgoing.  Strength at foot  Plantar-flexion: 5/5 Dorsi-flexion: 5/5 Eversion: 5/5 Inversion: 5/5  Leg strength  Quad: 5/5 Hamstring: 5/5 Hip flexor: 5/5 Hip abductors: 5/5  Gait unremarkable. .Neck: Inspection unremarkable. No palpable stepoffs. Positive Spurling's maneuver with radicular symptoms of the middle finger still present Still some limited range of motion. Grip strength and sensation normal in bilateral hands Strength good C4 to T1 distribution No sensory change to C4 to T1 Negative Hoffman sign bilaterally Reflexes normal   OMT Physical Exam  Thoracic T3 extended rotated and side bent left with inhaled rib T7 extended rotated and side bent right  Lumbar L2 flexed rotated inside that right L4 flexed rotated and side bent left  Sacrum Left on left  Ileum neutral     Impression and Recommendations:     This case required medical decision making of moderate complexity.

## 2015-06-19 ENCOUNTER — Encounter: Payer: Self-pay | Admitting: Family Medicine

## 2015-06-19 ENCOUNTER — Ambulatory Visit (INDEPENDENT_AMBULATORY_CARE_PROVIDER_SITE_OTHER): Payer: 59 | Admitting: Family Medicine

## 2015-06-19 VITALS — BP 104/64 | HR 64 | Temp 97.6°F | Wt 149.0 lb

## 2015-06-19 DIAGNOSIS — J069 Acute upper respiratory infection, unspecified: Secondary | ICD-10-CM

## 2015-06-19 DIAGNOSIS — R059 Cough, unspecified: Secondary | ICD-10-CM

## 2015-06-19 DIAGNOSIS — R05 Cough: Secondary | ICD-10-CM | POA: Diagnosis not present

## 2015-06-19 MED ORDER — BENZONATATE 100 MG PO CAPS: 100.0000 mg | ORAL_CAPSULE | Freq: Three times a day (TID) | ORAL | Status: DC

## 2015-06-19 NOTE — Progress Notes (Signed)
Pre visit review using our clinic review tool, if applicable. No additional management support is needed unless otherwise documented below in the visit note. 

## 2015-06-19 NOTE — Progress Notes (Signed)
Subjective:    Patient ID: Andrea Ortiz, female    DOB: 10-27-1960, 54 y.o.   MRN: [id number]  HPI  Andrea Ortiz is a 55 year old female who presents today with nonproductive cough, rhinitis, and sinus pressure/discomfort for 2 days. She is concerned with her symptoms because of an upcoming weekend trip out of town. Associated symptoms of loose stool without nausea or vomiting is noted. She denies ear pain, tooth pain, loss of sense of smell, and antibiotic use in the last 30 days. She reports recent exposure to sick contacts. Denies history of asthma and bronchitis. Former smoker with 22.5 pack years who quit in 1997. Treatment at home includes advil and mucinex today.   Review of Systems  Constitutional: Negative for chills.       Reports low grade fever this morning, No fever at this time  HENT: Positive for rhinorrhea and sinus pressure. Negative for congestion, ear pain, sneezing and sore throat.   Respiratory: Negative for cough, chest tightness, shortness of breath and wheezing.   Cardiovascular: Negative for chest pain and palpitations.  Gastrointestinal: Negative for nausea, vomiting and constipation.       Notes loose stools x 2 days.   Musculoskeletal: Negative for myalgias and arthralgias.  Skin: Negative for pallor and rash.  Neurological: Negative for dizziness, light-headedness and headaches.   Past Medical History  Diagnosis Date  . 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 PSVT   . EXANTHEM   . PALPITATIONS, RECURRENT   . Chest pain 02/09/2011  . Alcoholism in remission [facility])     doing well  2016     Social History   Social History  . Marital Status: Married    Spouse Name: N/A  . Number of Children: N/A  . Years of Education: N/A   Occupational History  . Not on file.   Social History Main Topics  . Smoking status: Former Smoker -- 1.50 packs/day for 15 years    Types: Cigarettes    Quit date: 08/12/1995  . Smokeless tobacco: Never  Used  . Alcohol Use: 3.0 oz/week    5 Glasses of wine per week  . Drug Use: Not on file  . Sexual Activity: Not on file   Other Topics Concern  . Not on file   Social History Narrative   Divorced- Remarried- Husband has a son at [place]   One child    Dog   Works [employer]      hhof 4  No ets.    Avoids caffiene etoh with dinner.   Exercise 5 d  Per week.    Sleep 6 hours or less     Past Surgical History  Procedure Laterality Date  . Rhinoplasty  1980    Family History  Problem Relation Age of Onset  . Alzheimer's disease Mother   . Heart attack Mother     age 64's  . Parkinsonism Father   . Other Sister     Hypoglycemia    Allergies  Allergen Reactions  . Escitalopram Oxalate     REACTION: hand s swelled up.    Current Outpatient Prescriptions on File Prior to Visit  Medication Sig Dispense Refill  . desoximetasone (TOPICORT) 0.05 % cream Apply topically 2 (two) times daily. For 2 weeks at a time     . Diclofenac Sodium 2 % SOLN Apply 1 pump twice daily. 112 g 3  . DULoxetine (CYMBALTA) 30 MG capsule Take  30 mg by mouth at bedtime.     . DULoxetine (CYMBALTA) 60 MG capsule TK ONE C PO QAM  1  . gabapentin (NEURONTIN) 300 MG capsule Take 1 tablet by mouth in the morning, 2 tablets in afternoon and 2 tablets at bedtime  2  . meloxicam (MOBIC) 15 MG tablet Take 1 tablet (15 mg total) by mouth daily. 90 tablet 1  . metoprolol (LOPRESSOR) 50 MG tablet TAKE 1/2 TABLET BY MOUTH TWICE DAILY OR AS DIRECTED 90 tablet 3  . tiZANidine (ZANAFLEX) 4 MG tablet Take 1 tablet (4 mg total) by mouth every 8 (eight) hours as needed for muscle spasms. 30 tablet 2  . traMADol (ULTRAM) 50 MG tablet Take 1 tablet (50 mg total) by mouth every 12 (twelve) hours as needed. 30 tablet 0   No current facility-administered medications on file prior to visit.    BP 104/64 mmHg  Pulse 64  Temp(Src) 97.6 F (36.4 C) (Oral)  Wt 149 lb (67.586 kg)       Objective:   Physical  Exam  Constitutional: She is oriented to person, place, and time. She appears well-developed and well-nourished.  HENT:  Right Ear: Tympanic membrane normal.  Left Ear: Tympanic membrane normal.  Nose: Rhinorrhea present. Right sinus exhibits frontal sinus tenderness. Left sinus exhibits frontal sinus tenderness.  Mouth/Throat: No posterior oropharyngeal erythema.  Eyes: Pupils are equal, round, and reactive to light.  Cardiovascular: Normal rate and regular rhythm.   Pulmonary/Chest: Effort normal and breath sounds normal. She has no wheezes. She has no rales.  Abdominal: Soft. She exhibits no distension. There is no tenderness.  Lymphadenopathy:    She has no cervical adenopathy.  Neurological: She is alert and oriented to person, place, and time.  Skin: Skin is warm and dry. No rash noted.  Psychiatric: She has a normal mood and affect.         Assessment & Plan:  1. Viral upper respiratory illness Advised patient on supportive measures: Rest, drink plenty of fluids enough to make urine pale yellow or clear, and use tylenol or ibuprofen as needed for pain. Follow up if fever >101, if symptoms worsen, or if symptoms are not improved in 3 days. Patient verbalizes understanding.   2. Cough Mucinex DM for cough as needed and benzonatate for cough not managed with Mucinex DM.

## 2015-06-19 NOTE — Patient Instructions (Signed)
It was a pleasure meeting you today. Please consider Mucinex DM for cough as needed and benzonatate for cough that is not controlled with Mucinex DM. Rest and drink enough fluids so urine is pale yellow or clear. If your symptoms do not improve in 3-4 days, worsen, or you develop a fever >101, please contact clinic for further evaluation.  Upper Respiratory Infection, Adult Most upper respiratory infections (URIs) are a viral infection of the air passages leading to the lungs. A URI affects the nose, throat, and upper air passages. The most common type of URI is nasopharyngitis and is typically referred to as "the common cold." URIs run their course and usually go away on their own. Most of the time, a URI does not require medical attention, but sometimes a bacterial infection in the upper airways can follow a viral infection. This is called a secondary infection. Sinus and middle ear infections are common types of secondary upper respiratory infections. Bacterial pneumonia can also complicate a URI. A URI can worsen asthma and chronic obstructive pulmonary disease (COPD). Sometimes, these complications can require emergency medical care and may be life threatening.  CAUSES Almost all URIs are caused by viruses. A virus is a type of germ and can spread from one person to another.  RISKS FACTORS You may be at risk for a URI if:   You smoke.   You have chronic heart or lung disease.  You have a weakened defense (immune) system.   You are very young or very old.   You have nasal allergies or asthma.  You work in crowded or poorly ventilated areas.  You work in health care facilities or schools. SIGNS AND SYMPTOMS  Symptoms typically develop 2-3 days after you come in contact with a cold virus. Most viral URIs last 7-10 days. However, viral URIs from the influenza virus (flu virus) can last 14-18 days and are typically more severe. Symptoms may include:   Runny or stuffy (congested) nose.    Sneezing.   Cough.   Sore throat.   Headache.   Fatigue.   Fever.   Loss of appetite.   Pain in your forehead, behind your eyes, and over your cheekbones (sinus pain).  Muscle aches.  DIAGNOSIS  Your health care provider may diagnose a URI by:  Physical exam.  Tests to check that your symptoms are not due to another condition such as:  Strep throat.  Sinusitis.  Pneumonia.  Asthma. TREATMENT  A URI goes away on its own with time. It cannot be cured with medicines, but medicines may be prescribed or recommended to relieve symptoms. Medicines may help:  Reduce your fever.  Reduce your cough.  Relieve nasal congestion. HOME CARE INSTRUCTIONS   Take medicines only as directed by your health care provider.   Gargle warm saltwater or take cough drops to comfort your throat as directed by your health care provider.  Use a warm mist humidifier or inhale steam from a shower to increase air moisture. This may make it easier to breathe.  Drink enough fluid to keep your urine clear or pale yellow.   Eat soups and other clear broths and maintain good nutrition.   Rest as needed.   Return to work when your temperature has returned to normal or as your health care provider advises. You may need to stay home longer to avoid infecting others. You can also use a face mask and careful hand washing to prevent spread of the virus.  Increase the  usage of your inhaler if you have asthma.   Do not use any tobacco products, including cigarettes, chewing tobacco, or electronic cigarettes. If you need help quitting, ask your health care provider. PREVENTION  The best way to protect yourself from getting a cold is to practice good hygiene.   Avoid oral or hand contact with people with cold symptoms.   Wash your hands often if contact occurs.  There is no clear evidence that vitamin C, vitamin E, echinacea, or exercise reduces the chance of developing a cold.  However, it is always recommended to get plenty of rest, exercise, and practice good nutrition.  SEEK MEDICAL CARE IF:   You are getting worse rather than better.   Your symptoms are not controlled by medicine.   You have chills.  You have worsening shortness of breath.  You have brown or red mucus.  You have yellow or brown nasal discharge.  You have pain in your face, especially when you bend forward.  You have a fever.  You have swollen neck glands.  You have pain while swallowing.  You have white areas in the back of your throat. SEEK IMMEDIATE MEDICAL CARE IF:   You have severe or persistent:  Headache.  Ear pain.  Sinus pain.  Chest pain.  You have chronic lung disease and any of the following:  Wheezing.  Prolonged cough.  Coughing up blood.  A change in your usual mucus.  You have a stiff neck.  You have changes in your:  Vision.  Hearing.  Thinking.  Mood. MAKE SURE YOU:   Understand these instructions.  Will watch your condition.  Will get help right away if you are not doing well or get worse.   This information is not intended to replace advice given to you by your health care provider. Make sure you discuss any questions you have with your health care provider.   Document Released: 10/13/2000 Document Revised: 09/03/2014 Document Reviewed: 07/25/2013 Elsevier Interactive Patient Education [employer].

## 2015-07-04 ENCOUNTER — Ambulatory Visit: Payer: Self-pay | Admitting: Family Medicine

## 2015-07-17 ENCOUNTER — Encounter: Payer: Self-pay | Admitting: Family Medicine

## 2015-07-17 ENCOUNTER — Ambulatory Visit (INDEPENDENT_AMBULATORY_CARE_PROVIDER_SITE_OTHER): Payer: 59 | Admitting: Family Medicine

## 2015-07-17 ENCOUNTER — Other Ambulatory Visit (INDEPENDENT_AMBULATORY_CARE_PROVIDER_SITE_OTHER): Payer: 59

## 2015-07-17 VITALS — BP 124/78 | HR 79 | Ht 66.0 in | Wt 144.0 lb

## 2015-07-17 DIAGNOSIS — M9902 Segmental and somatic dysfunction of thoracic region: Secondary | ICD-10-CM

## 2015-07-17 DIAGNOSIS — M9903 Segmental and somatic dysfunction of lumbar region: Secondary | ICD-10-CM | POA: Diagnosis not present

## 2015-07-17 DIAGNOSIS — M5010 Cervical disc disorder with radiculopathy, unspecified cervical region: Secondary | ICD-10-CM

## 2015-07-17 DIAGNOSIS — M533 Sacrococcygeal disorders, not elsewhere classified: Secondary | ICD-10-CM

## 2015-07-17 DIAGNOSIS — M9904 Segmental and somatic dysfunction of sacral region: Secondary | ICD-10-CM | POA: Diagnosis not present

## 2015-07-17 DIAGNOSIS — M999 Biomechanical lesion, unspecified: Secondary | ICD-10-CM

## 2015-07-17 LAB — TSH: TSH: 1.38 u[IU]/mL (ref 0.35–4.50)

## 2015-07-17 LAB — IBC PANEL
Iron: 99 ug/dL (ref 42–145)
Saturation Ratios: 26 % (ref 20.0–50.0)
TRANSFERRIN: 272 mg/dL (ref 212.0–360.0)

## 2015-07-17 LAB — SEDIMENTATION RATE: Sed Rate: 26 mm/hr — ABNORMAL HIGH (ref 0–22)

## 2015-07-17 LAB — C-REACTIVE PROTEIN: CRP: <0.1 mg/dL — ABNORMAL LOW (ref 0.5–20.0)

## 2015-07-17 LAB — VITAMIN D 25 HYDROXY (VIT D DEFICIENCY, FRACTURES): VITD: 13.33 ng/mL — AB (ref 30.00–100.00)

## 2015-07-17 MED ORDER — VITAMIN D (ERGOCALCIFEROL) 1.25 MG (50000 UNIT) PO CAPS: 50000.0000 [IU] | ORAL_CAPSULE | ORAL | Status: DC

## 2015-07-17 NOTE — Progress Notes (Signed)
Pre visit review using our clinic review tool, if applicable. No additional management support is needed unless otherwise documented below in the visit note. 

## 2015-07-17 NOTE — Assessment & Plan Note (Signed)
On exam today patient seems to be worsening. Possible mild weakness noted as well. Patient is concerned that this could be an inherited problem with her father going through very similar presentation she states. We will get labs to rule out this out. I do believe with patient feeling all other conservative therapy and no significant improvement and now with some mild weakness I do think that advance imaging is warranted. MRI ordered today. We discussed avoiding certain activities until we have this imaging is warranted. Patient will continue the other medications. Patient come back and see me. Depending on them we'll either discuss the possibility for epidural injections or surgical intervention.  Spent  25 minutes with patient face-to-face and had greater than 50% of counseling including as described above in assessment and plan.

## 2015-07-17 NOTE — Patient Instructions (Signed)
God to see you are you are too sweet.  Thank you Keep up with the ice Vitamin D once weekly for the next 3 months.  Iron 65mg  daily with 500mg  of vitamin C We will get labs downstairs today  MRI is ordered and I will write you in my chart on the next step If we do the epidural I want to see you again 2 weeks after the injection.

## 2015-07-17 NOTE — Progress Notes (Signed)
[facility] [address], [place] [address] Phone: [phone] Subjective:     CC: Back pain follow up, neck pain And radicular symptoms follow-up  [id number] Andrea Ortiz is a 55 y.o. female coming in with complaint of back pain. Patient does have chronic back pain as well as sacroiliac joint dysfunction.  States overall her lower back seems to be fairly stable. Less severe than her neck recently.  Patient is complaining of neck pain. Patient states that she's had known arthritic changes in the neck. Was having a positive Spurling's at last visit. Patient was given prednisone as well as gabapentin.  Patient had mild response. Patient then was given exercises and went to formal physical therapy 1 time. Patient states that she has made another maybe 10% improvement with very minimal. Continues to have the radicular symptoms going down the left arm. Seems to be in the middle finger. Maybe some mild weakness but nothing that she consider severe. Patient to states that she is no longer working on a regular basis. Affecting some of her daily activities flexed sitting at her desk for long amount of time.  PMHX for lumbar: MRI shows patient at that time did have lumbar facet arthropathy with degenerative disc disease mostly at the L4-L5 area.  February 23rd 2015 lumbar and thoracic x-rays were taken and did show multilevel degenerative changes mostly of the facet arthropathy.    Past Medical History  Diagnosis Date  . 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 PSVT   . EXANTHEM   . PALPITATIONS, RECURRENT   . Chest pain 02/09/2011  . Alcoholism in remission [facility])     doing well  2016    Past Surgical History  Procedure Laterality Date  . Rhinoplasty  1980   Social History  Substance Use Topics  . Smoking status: Former Smoker -- 1.50 packs/day for 15 years    Types: Cigarettes    Quit date: 08/12/1995  . Smokeless tobacco: Never  Used  . Alcohol Use: 3.0 oz/week    5 Glasses of wine per week   Allergies  Allergen Reactions  . Escitalopram Oxalate     REACTION: hand s swelled up.   Family History  Problem Relation Age of Onset  . Alzheimer's disease Mother   . Heart attack Mother     age 81's  . Parkinsonism Father   . Other Sister     Hypoglycemia    Past medical history, social, surgical and family history all reviewed in electronic medical record.   Review of Systems: No headache, visual changes, nausea, vomiting, diarrhea, constipation, dizziness, abdominal pain, skin rash, fevers, chills, night sweats, weight loss, swollen lymph nodes, body aches, joint swelling, muscle aches, chest pain, shortness of breath, mood changes.   Objective Blood pressure 124/78, pulse 79, height 5\' 6"  (1.676 m), weight 144 lb (65.318 kg), SpO2 99 %.  General: No apparent distress alert and oriented x3 mood and affect normal, dressed appropriately.  HEENT: Pupils equal, extraocular movements intact  Respiratory: Patient's speak in full sentences and does not appear short of breath  Cardiovascular: No lower extremity edema, non tender, no erythema  Skin: Warm dry intact with no signs of infection or rash on extremities or on axial skeleton.  Abdomen: Soft nontender  Neuro: Cranial nerves II through XII are intact, neurovascularly intact in all extremities with 2+ DTRs and 2+ pulses.  Lymph: No lymphadenopathy of posterior or anterior cervical  chain or axillae bilaterally.  Gait normal with good balance and coordination.  MSK:  Non tender with full range of motion and good stability and symmetric strength and tone of shoulders, elbows, wrist, hip, knee and ankles bilaterally.  Back Exam:  Inspection: Mild dextroscoliosis of the lumbar spine  Motion: Flexion 45 deg, Extension 25 deg, Side Bending to 45 deg bilaterally,  Rotation to 45 deg bilaterally  SLR laying: Negative  XSLR laying: Negative  Palpable tenderness:  Continued tenderness of the hip flexors FABER: Negative  Sensory change: Gross sensation intact to all lumbar and sacral dermatomes.  Reflexes: 2+ at both patellar tendons, 2+ at achilles tendons, Babinski's downgoing.  Strength at foot  Plantar-flexion: 5/5 Dorsi-flexion: 5/5 Eversion: 5/5 Inversion: 5/5  Leg strength  Quad: 5/5 Hamstring: 5/5 Hip flexor: 5/5 Hip abductors: 4/5 but symmetric Gait unremarkable. .Neck: Inspection unremarkable. No palpable stepoffs. Positive Spurling's maneuver with radicular symptoms of the middle finger left still present and may be worse than previous exam Still some limited range of motion. Grip strength and sensation normal in bilateral hands Very minimal weakness of the C7 distribution compared to the contralateral side which is new from previous exam No sensory change to C4 to T1 Negative Hoffman sign bilaterally Reflexes normal   OMT Physical Exam  Thoracic T3 extended rotated and side bent left with inhaled rib T7 extended rotated and side bent right  Lumbar L2 flexed rotated inside that right L4 flexed rotated and side bent left  Sacrum Left on left  Ileum neutral     Impression and Recommendations:     This case required medical decision making of moderate complexity.

## 2015-07-17 NOTE — Assessment & Plan Note (Signed)
Decision today to treat with OMT was based on Physical Exam  After verbal consent patient was treated with HVLA and muscle energy techniques in thoracic, lumbar, sacral and ilium areas did not manipulate cervical region again  Patient tolerated the procedure well with improvement in symptoms  Patient given exercises, stretches and lifestyle modifications  See medications in patient instructions if given  Patient will follow up in 6 weeks for further manipulation.

## 2015-07-18 LAB — CYCLIC CITRUL PEPTIDE ANTIBODY, IGG

## 2015-07-18 LAB — ANTI-DNA ANTIBODY, DOUBLE-STRANDED: ds DNA Ab: <1 IU/mL

## 2015-07-18 LAB — ANA: ANA: NEGATIVE

## 2015-07-22 ENCOUNTER — Telehealth: Payer: Self-pay | Admitting: *Deleted

## 2015-07-22 MED ORDER — DIAZEPAM 5 MG PO TABS: ORAL_TABLET | ORAL | Status: DC

## 2015-07-22 NOTE — Telephone Encounter (Signed)
Pt left msg requesting valium to take before MRI on Friday. rx sent into pharmacy. Pt made aware.

## 2015-07-25 ENCOUNTER — Ambulatory Visit
Admission: RE | Admit: 2015-07-25 | Discharge: 2015-07-25 | Disposition: A | Discharge status: Home / Self Care | Payer: 59 | Source: Ambulatory Visit | Attending: Family Medicine | Admitting: Family Medicine

## 2015-07-25 DIAGNOSIS — M5010 Cervical disc disorder with radiculopathy, unspecified cervical region: Secondary | ICD-10-CM

## 2015-07-31 ENCOUNTER — Telehealth: Payer: Self-pay | Admitting: Family Medicine

## 2015-07-31 NOTE — Telephone Encounter (Signed)
Patient is calling to inquire on MRI results. Please give her a call at your convenience.

## 2015-07-31 NOTE — Telephone Encounter (Signed)
Called and left her a message, discussed to consider epidural. Overall moderate arthritis.

## 2015-08-01 ENCOUNTER — Encounter: Payer: Self-pay | Admitting: Family Medicine

## 2015-08-04 ENCOUNTER — Other Ambulatory Visit: Payer: Self-pay

## 2015-08-04 DIAGNOSIS — M5010 Cervical disc disorder with radiculopathy, unspecified cervical region: Secondary | ICD-10-CM

## 2015-08-05 ENCOUNTER — Encounter: Payer: Self-pay | Admitting: Internal Medicine

## 2015-08-20 ENCOUNTER — Ambulatory Visit
Admission: RE | Admit: 2015-08-20 | Discharge: 2015-08-20 | Disposition: A | Discharge status: Home / Self Care | Payer: 59 | Source: Ambulatory Visit | Attending: Family Medicine | Admitting: Family Medicine

## 2015-08-20 DIAGNOSIS — M5010 Cervical disc disorder with radiculopathy, unspecified cervical region: Secondary | ICD-10-CM

## 2015-08-20 MED ORDER — IOHEXOL 300 MG/ML  SOLN
1.0000 mL | Freq: Once | INTRAMUSCULAR | Status: AC | PRN
  Administered 2015-08-20: 1 mL via EPIDURAL

## 2015-08-20 MED ORDER — TRIAMCINOLONE ACETONIDE 40 MG/ML IJ SUSP (RADIOLOGY)
60.0000 mg | Freq: Once | INTRAMUSCULAR | Status: AC
  Administered 2015-08-20: 60 mg via EPIDURAL

## 2015-08-20 NOTE — Discharge Instructions (Signed)

## 2015-08-25 ENCOUNTER — Inpatient Hospital Stay: Admission: RE | Admit: 2015-08-25 | Payer: Self-pay | Source: Ambulatory Visit

## 2015-09-01 ENCOUNTER — Telehealth: Payer: Self-pay | Admitting: Radiology

## 2015-09-01 NOTE — Telephone Encounter (Signed)
Pt called re: still having pain, post injection. Explained that sometimes it took more than 1 injection to take care of all the pain and to give Dr. Tamala Julian a call and talk to him about it.

## 2015-09-02 ENCOUNTER — Other Ambulatory Visit: Payer: Self-pay

## 2015-09-02 ENCOUNTER — Encounter: Payer: Self-pay | Admitting: Family Medicine

## 2015-09-02 DIAGNOSIS — M5010 Cervical disc disorder with radiculopathy, unspecified cervical region: Secondary | ICD-10-CM

## 2015-09-08 ENCOUNTER — Encounter: Payer: Self-pay | Admitting: Internal Medicine

## 2015-09-12 ENCOUNTER — Other Ambulatory Visit: Payer: Self-pay | Admitting: Family Medicine

## 2015-09-12 ENCOUNTER — Ambulatory Visit
Admission: RE | Admit: 2015-09-12 | Discharge: 2015-09-12 | Disposition: A | Discharge status: Home / Self Care | Payer: 59 | Source: Ambulatory Visit | Attending: Family Medicine | Admitting: Family Medicine

## 2015-09-12 DIAGNOSIS — M5010 Cervical disc disorder with radiculopathy, unspecified cervical region: Secondary | ICD-10-CM

## 2015-09-12 MED ORDER — DEXAMETHASONE SODIUM PHOSPHATE 4 MG/ML IJ SOLN
6.0000 mg | Freq: Once | INTRAMUSCULAR | Status: AC
  Administered 2015-09-12: 6 mg via INTRA_ARTICULAR

## 2015-09-12 MED ORDER — IOHEXOL 300 MG/ML  SOLN: 1.0000 mL | Freq: Once | INTRAMUSCULAR | Status: DC | PRN

## 2015-09-12 MED ORDER — TRIAMCINOLONE ACETONIDE 40 MG/ML IJ SUSP (RADIOLOGY): 60.0000 mg | Freq: Once | INTRAMUSCULAR | Status: DC

## 2015-09-12 NOTE — Telephone Encounter (Signed)
Andrea Ortiz   It looks like i ordered  tmc with refills at her last visit    Please  Check into this and if not sent then re  rx   Thanks

## 2015-10-02 ENCOUNTER — Other Ambulatory Visit: Payer: Self-pay | Admitting: Family Medicine

## 2015-10-03 NOTE — Telephone Encounter (Signed)
Refill done.  

## 2015-11-20 ENCOUNTER — Ambulatory Visit (INDEPENDENT_AMBULATORY_CARE_PROVIDER_SITE_OTHER): Payer: 59 | Admitting: Family Medicine

## 2015-11-20 ENCOUNTER — Encounter: Payer: Self-pay | Admitting: Family Medicine

## 2015-11-20 VITALS — BP 118/76 | HR 76 | Wt 143.0 lb

## 2015-11-20 DIAGNOSIS — M9902 Segmental and somatic dysfunction of thoracic region: Secondary | ICD-10-CM

## 2015-11-20 DIAGNOSIS — M9903 Segmental and somatic dysfunction of lumbar region: Secondary | ICD-10-CM | POA: Diagnosis not present

## 2015-11-20 DIAGNOSIS — M5010 Cervical disc disorder with radiculopathy, unspecified cervical region: Secondary | ICD-10-CM | POA: Diagnosis not present

## 2015-11-20 DIAGNOSIS — M999 Biomechanical lesion, unspecified: Secondary | ICD-10-CM

## 2015-11-20 DIAGNOSIS — M9904 Segmental and somatic dysfunction of sacral region: Secondary | ICD-10-CM

## 2015-11-20 MED ORDER — MELOXICAM 15 MG PO TABS: 15.0000 mg | ORAL_TABLET | Freq: Every day | ORAL | Status: DC

## 2015-11-20 MED ORDER — GABAPENTIN 400 MG PO CAPS: 800.0000 mg | ORAL_CAPSULE | Freq: Three times a day (TID) | ORAL | Status: DC

## 2015-11-20 NOTE — Assessment & Plan Note (Signed)
Patient is having more radicular symptoms. Did respond better to an epidural at C7-T1 and she didn't for the specific facet injection. Encourage her to try this again. Patient does have gabapentin and was responding that we will increase her dose. Patient will do 400 mg 2 times daily and then 800 mg at night. If any significant grogginess we will decrease dose again. atient has noticed some improvement with the once weekly vitamin D with the aching sensation.s muscle relaxer as well as meloxicam for breakthrough pain.return to clinic again in 3 weeks

## 2015-11-20 NOTE — Assessment & Plan Note (Signed)
Decision today to treat with OMT was based on Physical Exam  After verbal consent patient was treated with HVLA and muscle energy techniques in thoracic, lumbar, sacral and ilium areas did not manipulate cervical region again  Patient tolerated the procedure well with improvement in symptoms  Patient given exercises, stretches and lifestyle modifications  See medications in patient instructions if given  Patient will follow up in 6 weeks for further manipulation.

## 2015-11-20 NOTE — Progress Notes (Signed)
[name] [address], [place] [address] Phone: [phone] Subjective:     CC: neck pain follow-up  [id number] Andrea Ortiz is a 55 y.o. female coming in with complaint of neck and back pain.  Patient is complaining of neck pain. Patient states that she's had known arthritic changes in the neck. Patient over the course of year has had 2 epidural steroid injections of the neck. Patient's last one was in May and was more of a facet injection. No significant improvement with that injection. Had better results with the epidural. Patient continues on medications including 90 mg of Cymbalta. Continues to have pain daily. Having worsening radicular symptoms down the arm. Patient states that it's starting to affect daily activities. Has responded fairly well to osteopathic manipulation in the past.  Low back pain seemed to be stable overall. As long she does the stretches it seems to go and was also on at all. Seems to be a little tighter than usual.  PMHX for lumbar: MRI shows patient at that time did have lumbar facet arthropathy with degenerative disc disease mostly at the L4-L5 area.  February 23rd 2015 lumbar and thoracic x-rays were taken and did show multilevel degenerative changes mostly of the facet arthropathy.    Past Medical History  Diagnosis Date  . 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 PSVT   . EXANTHEM   . PALPITATIONS, RECURRENT   . Chest pain 02/09/2011  . Alcoholism in remission [facility])     doing well  2016    Past Surgical History  Procedure Laterality Date  . Rhinoplasty  1980   Social History  Substance Use Topics  . Smoking status: Former Smoker -- 1.50 packs/day for 15 years    Types: Cigarettes    Quit date: 08/12/1995  . Smokeless tobacco: Never Used  . Alcohol Use: 3.0 oz/week    5 Glasses of wine per week   Allergies  Allergen Reactions  . Escitalopram Oxalate Swelling    hands swelled     Family History  Problem Relation Age of Onset  . Alzheimer's disease Mother   . Heart attack Mother     age 31's  . Parkinsonism Father   . Other Sister     Hypoglycemia    Past medical history, social, surgical and family history all reviewed in electronic medical record.   Review of Systems: No headache, visual changes, nausea, vomiting, diarrhea, constipation, dizziness, abdominal pain, skin rash, fevers, chills, night sweats, weight loss, swollen lymph nodes, body aches, joint swelling, muscle aches, chest pain, shortness of breath, mood changes.   Objective Blood pressure 118/76, pulse 76, weight 143 lb (64.864 kg).  General: No apparent distress alert and oriented x3 mood and affect normal, dressed appropriately.  HEENT: Pupils equal, extraocular movements intact  Respiratory: Patient's speak in full sentences and does not appear short of breath  Cardiovascular: No lower extremity edema, non tender, no erythema  Skin: Warm dry intact with no signs of infection or rash on extremities or on axial skeleton.  Abdomen: Soft nontender  Neuro: Cranial nerves II through XII are intact, neurovascularly intact in all extremities with 2+ DTRs and 2+ pulses.  Lymph: No lymphadenopathy of posterior or anterior cervical chain or axillae bilaterally.  Gait normal with good balance and coordination.  MSK:  Non tender with full range of motion and good stability and symmetric strength and tone of shoulders, elbows, wrist, hip,  knee and ankles bilaterally.  Back Exam:  Inspection: Mild dextroscoliosis of the lumbar spine  Motion: Flexion 45 deg, Extension 25 deg, Side Bending to 45 deg bilaterally,  Rotation to 45 deg bilaterally  SLR laying: Negative  XSLR laying: Negative  Palpable tenderness: Continued tenderness of the hip flexorswith mild tightness FABER: Negative  Sensory change: Gross sensation intact to all lumbar and sacral dermatomes.  Reflexes: 2+ at both patellar tendons, 2+ at  achilles tendons, Babinski's downgoing.  Strength at foot  Plantar-flexion: 5/5 Dorsi-flexion: 5/5 Eversion: 5/5 Inversion: 5/5  Leg strength  Quad: 5/5 Hamstring: 5/5 Hip flexor: 5/5 Hip abductors: 4/5 but symmetric Gait unremarkable. .Neck: Inspection unremarkable. No palpable stepoffs. Positive Spurling's maneuver with radicular symptoms of the middle finger left still present an worsening from previous exam Lacking the last 10 of extension which is worse Grip strength and sensation normal in bilateral hands mild weakness of the C7 distribution compared to the contralateral side.this is worse as well No sensory change to C4 to T1 Negative Hoffman sign bilaterally Reflexes normal   OMT Physical Exam  Thoracic T3 extended rotated and side bent left with inhaled rib T7 extended rotated and side bent right  Lumbar L2 flexed rotated inside that right L4 flexed rotated and side bent left  Sacrum Left on left  Ileum neutral     Impression and Recommendations:     This case required medical decision making of moderate complexity.

## 2015-11-20 NOTE — Patient Instructions (Signed)
Gabapentin 400/800/800 Ice is still good and we will get the epidural and hope it helps Stay active [place]  See me again in 6 weeks for maniptulation and send a message in 2 weeks after epidural so I know you are doing well Tart cherry extract any dose at night can help but decrease your melatonin.

## 2015-12-10 ENCOUNTER — Other Ambulatory Visit: Payer: Self-pay | Admitting: Internal Medicine

## 2015-12-10 NOTE — Telephone Encounter (Signed)
DENIED.  DUPLICATE REQUEST.  SENT IN EARLIER TODAY. 

## 2015-12-10 NOTE — Telephone Encounter (Signed)
Sent to the pharmacy by e-scribe.  Pt has up coming cpx on 01/19/16

## 2015-12-15 ENCOUNTER — Other Ambulatory Visit: Payer: Self-pay | Admitting: Family Medicine

## 2015-12-15 NOTE — Telephone Encounter (Signed)
Refill done.  

## 2015-12-16 ENCOUNTER — Other Ambulatory Visit: Payer: Self-pay

## 2015-12-22 ENCOUNTER — Other Ambulatory Visit: Payer: Self-pay

## 2015-12-30 ENCOUNTER — Ambulatory Visit
Admission: RE | Admit: 2015-12-30 | Discharge: 2015-12-30 | Disposition: A | Discharge status: Home / Self Care | Payer: 59 | Source: Ambulatory Visit | Attending: Family Medicine | Admitting: Family Medicine

## 2015-12-30 DIAGNOSIS — M5010 Cervical disc disorder with radiculopathy, unspecified cervical region: Secondary | ICD-10-CM

## 2015-12-30 MED ORDER — METHYLPREDNISOLONE ACETATE 40 MG/ML INJ SUSP (RADIOLOG: 120.0000 mg | Freq: Once | INTRAMUSCULAR | Status: DC

## 2015-12-30 MED ORDER — IOPAMIDOL (ISOVUE-M 200) INJECTION 41%: 1.0000 mL | Freq: Once | INTRAMUSCULAR | Status: DC

## 2015-12-30 MED ORDER — IOPAMIDOL (ISOVUE-M 300) INJECTION 61%
1.0000 mL | Freq: Once | INTRAMUSCULAR | Status: AC | PRN
  Administered 2015-12-30: 1 mL via EPIDURAL

## 2015-12-30 MED ORDER — TRIAMCINOLONE ACETONIDE 40 MG/ML IJ SUSP (RADIOLOGY)
60.0000 mg | Freq: Once | INTRAMUSCULAR | Status: AC
  Administered 2015-12-30: 60 mg via EPIDURAL

## 2016-01-12 ENCOUNTER — Other Ambulatory Visit: Payer: Self-pay | Admitting: Family Medicine

## 2016-01-12 NOTE — Telephone Encounter (Signed)
Refill done.  

## 2016-01-14 ENCOUNTER — Other Ambulatory Visit (INDEPENDENT_AMBULATORY_CARE_PROVIDER_SITE_OTHER): Payer: 59

## 2016-01-14 DIAGNOSIS — Z0000 Encounter for general adult medical examination without abnormal findings: Secondary | ICD-10-CM

## 2016-01-14 LAB — CBC WITH DIFFERENTIAL/PLATELET
BASOS ABS: 0 10*3/uL (ref 0.0–0.1)
Basophils Relative: 0.5 % (ref 0.0–3.0)
Eosinophils Absolute: 0.1 10*3/uL (ref 0.0–0.7)
Eosinophils Relative: 2 % (ref 0.0–5.0)
HEMATOCRIT: 33.5 % — AB (ref 36.0–46.0)
Hemoglobin: 11.6 g/dL — ABNORMAL LOW (ref 12.0–15.0)
LYMPHS PCT: 44.1 % (ref 12.0–46.0)
Lymphs Abs: 2 10*3/uL (ref 0.7–4.0)
MCHC: 34.6 g/dL (ref 30.0–36.0)
MCV: 94.4 fl (ref 78.0–100.0)
MONOS PCT: 9.4 % (ref 3.0–12.0)
Monocytes Absolute: 0.4 10*3/uL (ref 0.1–1.0)
NEUTROS ABS: 2 10*3/uL (ref 1.4–7.7)
Neutrophils Relative %: 44 % (ref 43.0–77.0)
PLATELETS: 323 10*3/uL (ref 150.0–400.0)
RBC: 3.55 Mil/uL — ABNORMAL LOW (ref 3.87–5.11)
RDW: 12.3 % (ref 11.5–15.5)
WBC: 4.6 10*3/uL (ref 4.0–10.5)

## 2016-01-14 LAB — HEPATIC FUNCTION PANEL
ALBUMIN: 4.2 g/dL (ref 3.5–5.2)
ALK PHOS: 33 U/L — AB (ref 39–117)
ALT: 18 U/L (ref 0–35)
AST: 24 U/L (ref 0–37)
Bilirubin, Direct: 0 mg/dL (ref 0.0–0.3)
TOTAL PROTEIN: 6.6 g/dL (ref 6.0–8.3)
Total Bilirubin: 0.4 mg/dL (ref 0.2–1.2)

## 2016-01-14 LAB — LIPID PANEL
Cholesterol: 329 mg/dL — ABNORMAL HIGH (ref 0–200)
HDL: 79.4 mg/dL (ref >39.00)
LDL Cholesterol: 233 mg/dL — ABNORMAL HIGH (ref 0–99)
NONHDL: 249.71
Total CHOL/HDL Ratio: 4
Triglycerides: 84 mg/dL (ref 0.0–149.0)
VLDL: 16.8 mg/dL (ref 0.0–40.0)

## 2016-01-14 LAB — BASIC METABOLIC PANEL
BUN: 10 mg/dL (ref 6–23)
CALCIUM: 8.9 mg/dL (ref 8.4–10.5)
CHLORIDE: 99 meq/L (ref 96–112)
CO2: 27 meq/L (ref 19–32)
Creatinine, Ser: 0.9 mg/dL (ref 0.40–1.20)
GFR: 69 mL/min (ref >60.00)
Glucose, Bld: 104 mg/dL — ABNORMAL HIGH (ref 70–99)
POTASSIUM: 3.9 meq/L (ref 3.5–5.1)
SODIUM: 133 meq/L — AB (ref 135–145)

## 2016-01-14 LAB — TSH: TSH: 2.18 u[IU]/mL (ref 0.35–4.50)

## 2016-01-17 NOTE — Progress Notes (Signed)
Pre visit review using our clinic review tool, if applicable. No additional management support is needed unless otherwise documented below in the visit note.  Chief Complaint  Patient presents with  . Annual Exam    HPI: Patient  Andrea Ortiz  55 y.o. comes in today for Preventive Health Care visit  Feels well under care for MS  Issues per dr Tamala Julian but doing well .  etoh free on meds gaba and cymbalta  Has psych stable. Has had gyne check and  mammo done utd  No    Svt doing well low dose metoprolol    Health Maintenance  Topic Date Due  . COLONOSCOPY  09/09/2010  . Hepatitis C Screening  01/18/2017 (Originally [birth year])  . HIV Screening  01/18/2017 (Originally 09/09/1975)  . PAP SMEAR  09/26/2016  . MAMMOGRAM  10/08/2016  . TETANUS/TDAP  01/06/2025  . INFLUENZA VACCINE  Completed   Health Maintenance Review LIFESTYLE:  Exercise:  Yes regular  Tobacco/ETS:n Alcohol: recovering no Sugar beverages:n Sleep: 8 hours  Drug use: no HH of 3 Work:45-50 hours per week     ROS:  GEN/ HEENT: No fever, significant weight changes sweats headaches vision problems hearing changes, CV/ PULM; No chest pain shortness of breath cough, syncope,edema  change in exercise tolerance. GI /GU: No adominal pain, vomiting, change in bowel habits. No blood in the stool. No significant GU symptoms. SKIN/HEME: ,no acute skin rashes suspicious lesions or bleeding. No lymphadenopathy, nodules, masses.  NEURO/ PSYCH:  No neurologic signs such as weakness numbness. No depression anxiety. IMM/ Allergy: No unusual infections.  Allergy .   REST of 12 system review negative except as per HPI   Past Medical History:  Diagnosis Date  . 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 Alcoholism in remission ([place])    doing well  2016   . Chest pain 02/09/2011  . EXANTHEM   . PALPITATIONS, RECURRENT   . PSVT     Past Surgical History:  Procedure Laterality Date  . RHINOPLASTY  1980     Family History  Problem Relation Age of Onset  . Alzheimer's disease Mother   . Heart attack Mother     age 14's  . Parkinsonism Father   . Other Sister     Hypoglycemia    Social History   Social History  . Marital status: Married    Spouse name: N/A  . Number of children: N/A  . Years of education: N/A   Social History Main Topics  . Smoking status: Former Smoker    Packs/day: 1.50    Years: 15.00    Types: Cigarettes    Quit date: 08/12/1995  . Smokeless tobacco: Never Used  . Alcohol use 3.0 oz/week    5 Glasses of wine per week  . Drug use: Unknown  . Sexual activity: Not Asked   Other Topics Concern  . None   Social History Narrative   Divorced- Remarried- Husband has a son at [place]   One child    Dog   Works [employer]      hhof 4  No ets.    Avoids caffiene etoh with dinner.   Exercise 5 d  Per week.    Sleep 6 hours or less     Outpatient Medications Prior to Visit  Medication Sig Dispense Refill  . desoximetasone (TOPICORT) 0.05 % cream Apply topically 2 (two) times daily. For 2 weeks at a time     .  diazepam (VALIUM) 5 MG tablet Take 1 tablet 30 minutes before procedure. 2 tablet 0  . Diclofenac Sodium 2 % SOLN Apply 1 pump twice daily. 112 g 3  . DULoxetine (CYMBALTA) 30 MG capsule Take 30 mg by mouth at bedtime.     . DULoxetine (CYMBALTA) 60 MG capsule TK ONE C PO QAM  1  . gabapentin (NEURONTIN) 400 MG capsule TAKE 2 CAPSULES(800 MG) BY MOUTH THREE TIMES DAILY 180 capsule 0  . meloxicam (MOBIC) 15 MG tablet Take 1 tablet (15 mg total) by mouth daily. 90 tablet 1  . metoprolol (LOPRESSOR) 50 MG tablet TAKE 1/2 TABLET BY MOUTH TWICE DAILY OR AS DIRECTED 90 tablet 0  . Vitamin D, Ergocalciferol, (DRISDOL) 50000 units CAPS capsule TAKE 1 CAPSULE BY MOUTH EVERY 7 DAYS 12 capsule 0  . benzonatate (TESSALON) 100 MG capsule Take 1 capsule (100 mg total) by mouth 3 (three) times daily. 20 capsule 0  . metoprolol (LOPRESSOR) 50 MG tablet TAKE  1/2 TABLET BY MOUTH TWICE DAILY OR AS DIRECTED 90 tablet 3  . metoprolol (LOPRESSOR) 50 MG tablet TAKE 1/2 TABLET BY MOUTH TWICE DAILY OR AS DIRECTED 90 tablet 0  . tiZANidine (ZANAFLEX) 4 MG tablet Take 1 tablet (4 mg total) by mouth every 8 (eight) hours as needed for muscle spasms. 30 tablet 2  . traMADol (ULTRAM) 50 MG tablet Take 1 tablet (50 mg total) by mouth every 12 (twelve) hours as needed. 30 tablet 0   No facility-administered medications prior to visit.      EXAM:  BP 120/72 (BP Location: Right Arm, Patient Position: Sitting, Cuff Size: Normal)   Temp 99.1 F (37.3 C) (Oral)   Ht 5' 5.75" (1.67 m)   Wt 142 lb (64.4 kg)   BMI 23.09 kg/m   Body mass index is 23.09 kg/m.  Physical Exam: Vital signs reviewed [id number] is a well-developed well-nourished alert cooperative    who appearsr stated age in no acute distress.  HEENT: normocephalic atraumatic , Eyes: PERRL EOM's full, conjunctiva clear, Nares: paten,t no deformity discharge or tenderness., Ears: no deformity EAC's clear TMs with normal landmarks. Mouth: clear OP, no lesions, edema.  Moist mucous membranes. Dentition in adequate repair. NECK: supple without masses, thyromegaly or bruits. CHEST/PULM:  Clear to auscultation and percussion breath sounds equal no wheeze , rales or rhonchi. No chest wall deformities or tenderness. CV: PMI is nondisplaced, S1 S2 no gallops, murmurs, rubs. Peripheral pulses are full without delay.No JVD . Breast: normal by inspection . No dimpling, discharge, masses, tenderness or discharge . ABDOMEN: Bowel sounds normal nontender  No guard or rebound, no hepato splenomegal no CVA tenderness.  No hernia. Extremtities:  No clubbing cyanosis or edema, no acute joint swelling or redness no focal atrophy NEURO:  Oriented x3, cranial nerves 3-12 appear to be intact, no obvious focal weakness,gait within normal limits no abnormal reflexes or asymmetrical SKIN: No acute rashes normal turgor, color,  no bruising or petechiae. PSYCH: Oriented, good eye contact, no obvious depression anxiety, cognition and judgment appear normal. LN: no cervical axillary inguinal adenopathy  Lab Results  Component Value Date   WBC 4.6 01/14/2016   HGB 11.6 (L) 01/14/2016   HCT 33.5 (L) 01/14/2016   PLT 323.0 01/14/2016   GLUCOSE 104 (H) 01/14/2016   CHOL 329 (H) 01/14/2016   TRIG 84.0 01/14/2016   HDL 79.40 01/14/2016   LDLDIRECT 155.4 06/07/2013   LDLCALC 233 (H) 01/14/2016   ALT 18 01/14/2016  AST 24 01/14/2016   NA 133 (L) 01/14/2016   K 3.9 01/14/2016   CL 99 01/14/2016   CREATININE 0.90 01/14/2016   BUN 10 01/14/2016   CO2 27 01/14/2016   TSH 2.18 01/14/2016    ASSESSMENT AND PLAN:  Discussed the following assessment and plan:  Visit for preventive health examination  Need for prophylactic vaccination and inoculation against influenza - Plan: Flu Vaccine QUAD 36+ mos PF IM (Fluarix & Fluzone Quad PF)  Medication management  HLD (hyperlipidemia) - Plan: Amb ref to Medical Nutrition Therapy-MNT  Anemia, unspecified anemia type  Patient Care Team: Burnis Medin, MD as PCP - General Josue Hector, MD as Attending Physician (Cardiology) Harle Battiest, MD as Consulting Physician (Obstetrics and Gynecology) Dan Humphreys (Nurse Practitioner) Lyndal Pulley, DO as Attending Physician (Family Medicine) Patient Instructions   We'll arrange a referral to dietitian nutritionist in regard to the increased cholesterol level from your baseline. Agreed to stop  Ice cream butter creams etc    Processed foods sugars etc.  Continue lifestyle intervention healthy eating and exercise .   plan repeat lipids( along with other labs; hg electrophoresis, ferritin, vit d and celiac panel )   In about 3 months  And decide on medication depending on results.  Can skip Ov if doing better with albs   Get your colonoscopy          Why follow it? Research shows. . Those who follow the  Mediterranean diet have a reduced risk of heart disease  . The diet is associated with a reduced incidence of Parkinson's and Alzheimer's diseases . People following the diet may have longer life expectancies and lower rates of chronic diseases  . The Dietary Guidelines for Americans recommends the Mediterranean diet as an eating plan to promote health and prevent disease  What Is the Mediterranean Diet?  . Healthy eating plan based on typical foods and recipes of Mediterranean-style cooking . The diet is primarily a plant based diet; these foods should make up a majority of meals   Starches - Plant based foods should make up a majority of meals - They are an important sources of vitamins, minerals, energy, antioxidants, and fiber - Choose whole grains, foods high in fiber and minimally processed items  - Typical grain sources include wheat, oats, barley, corn, brown rice, bulgar, farro, millet, polenta, couscous  - Various types of beans include chickpeas, lentils, fava beans, black beans, white beans   Fruits  Veggies - Large quantities of antioxidant rich fruits & veggies; 6 or more servings  - Vegetables can be eaten raw or lightly drizzled with oil and cooked  - Vegetables common to the traditional Mediterranean Diet include: artichokes, arugula, beets, broccoli, brussel sprouts, cabbage, carrots, celery, collard greens, cucumbers, eggplant, kale, leeks, lemons, lettuce, mushrooms, okra, onions, peas, peppers, potatoes, pumpkin, radishes, rutabaga, shallots, spinach, sweet potatoes, turnips, zucchini - Fruits common to the Mediterranean Diet include: apples, apricots, avocados, cherries, clementines, dates, figs, grapefruits, grapes, melons, nectarines, oranges, peaches, pears, pomegranates, strawberries, tangerines  Fats - Replace butter and margarine with healthy oils, such as olive oil, canola oil, and tahini  - Limit nuts to no more than a handful a day  - Nuts include walnuts, almonds,  pecans, pistachios, pine nuts  - Limit or avoid candied, honey roasted or heavily salted nuts - Olives are central to the Mediterranean diet - can be eaten whole or used in a variety of dishes   Meats  Protein - Limiting red meat: no more than a few times a month - When eating red meat: choose lean cuts and keep the portion to the size of deck of cards - Eggs: approx. 0 to 4 times a week  - Fish and lean poultry: at least 2 a week  - Healthy protein sources include, chicken, [place], lean beef, lamb - Increase intake of seafood such as tuna, salmon, trout, mackerel, shrimp, scallops - Avoid or limit high fat processed meats such as sausage and bacon  Dairy - Include moderate amounts of low fat dairy products  - Focus on healthy dairy such as fat free yogurt, skim milk, low or reduced fat cheese - Limit dairy products higher in fat such as whole or 2% milk, cheese, ice cream  Alcohol - Moderate amounts of red wine is ok  - No more than 5 oz daily for women (all ages) and men older than age 74  - No more than 10 oz of wine daily for men younger than 78  Other - Limit sweets and other desserts  - Use herbs and spices instead of salt to flavor foods  - Herbs and spices common to the traditional Mediterranean Diet include: basil, bay leaves, chives, cloves, cumin, fennel, garlic, lavender, marjoram, mint, oregano, parsley, pepper, rosemary, sage, savory, sumac, tarragon, thyme   It's not just a diet, it's a lifestyle:  . The Mediterranean diet includes lifestyle factors typical of those in the region  . Foods, drinks and meals are best eaten with others and savored . Daily physical activity is important for overall good health . This could be strenuous exercise like running and aerobics . This could also be more leisurely activities such as walking, housework, yard-work, or taking the stairs . Moderation is the key; a balanced and healthy diet accommodates most foods and drinks . Consider portion  sizes and frequency of consumption of certain foods   Meal Ideas & Options:  . Breakfast:  o Whole wheat toast or whole wheat English muffins with peanut butter & hard boiled egg o Steel cut oats topped with apples & cinnamon and skim milk  o Fresh fruit: banana, strawberries, melon, berries, peaches  o Smoothies: strawberries, bananas, greek yogurt, peanut butter o Low fat greek yogurt with blueberries and granola  o Egg white omelet with spinach and mushrooms o Breakfast couscous: whole wheat couscous, apricots, skim milk, cranberries  . Sandwiches:  o Hummus and grilled vegetables (peppers, zucchini, squash) on whole wheat bread   o Grilled chicken on whole wheat pita with lettuce, tomatoes, cucumbers or tzatziki  o Tuna salad on whole wheat bread: tuna salad made with greek yogurt, olives, red peppers, capers, green onions o Garlic rosemary lamb pita: lamb sauted with garlic, rosemary, salt & pepper; add lettuce, cucumber, greek yogurt to pita - flavor with lemon juice and black pepper  . Seafood:  o Mediterranean grilled salmon, seasoned with garlic, basil, parsley, lemon juice and black pepper o Shrimp, lemon, and spinach whole-grain pasta salad made with low fat greek yogurt  o Seared scallops with lemon orzo  o Seared tuna steaks seasoned salt, pepper, coriander topped with tomato mixture of olives, tomatoes, olive oil, minced garlic, parsley, green onions and cappers  . Meats:  o Herbed greek chicken salad with kalamata olives, cucumber, feta  o Red bell peppers stuffed with spinach, bulgur, lean ground beef (or lentils) & topped with feta   o Kebabs: skewers of chicken, tomatoes, onions, zucchini, squash  o [place] burgers: made with red onions, mint, dill, lemon juice, feta cheese topped with roasted red peppers . Vegetarian o Cucumber salad: cucumbers, artichoke hearts, celery, red onion, feta cheese, tossed in olive oil & lemon juice  o Hummus and whole grain pita points with  a greek salad (lettuce, tomato, feta, olives, cucumbers, red onion) o Lentil soup with celery, carrots made with vegetable broth, garlic, salt and pepper  o Tabouli salad: parsley, bulgur, mint, scallions, cucumbers, tomato, radishes, lemon juice, olive oil, salt and pepper.        Standley Brooking. Malillany Kazlauskas M.D.

## 2016-01-19 ENCOUNTER — Ambulatory Visit (INDEPENDENT_AMBULATORY_CARE_PROVIDER_SITE_OTHER): Payer: 59 | Admitting: Internal Medicine

## 2016-01-19 ENCOUNTER — Encounter: Payer: Self-pay | Admitting: Internal Medicine

## 2016-01-19 VITALS — BP 120/72 | Temp 99.1°F | Ht 65.75 in | Wt 142.0 lb

## 2016-01-19 DIAGNOSIS — Z23 Encounter for immunization: Secondary | ICD-10-CM | POA: Diagnosis not present

## 2016-01-19 DIAGNOSIS — Z79899 Other long term (current) drug therapy: Secondary | ICD-10-CM

## 2016-01-19 DIAGNOSIS — E785 Hyperlipidemia, unspecified: Secondary | ICD-10-CM | POA: Diagnosis not present

## 2016-01-19 DIAGNOSIS — Z0000 Encounter for general adult medical examination without abnormal findings: Secondary | ICD-10-CM | POA: Diagnosis not present

## 2016-01-19 DIAGNOSIS — D649 Anemia, unspecified: Secondary | ICD-10-CM | POA: Diagnosis not present

## 2016-01-19 NOTE — Patient Instructions (Addendum)
We'll arrange a referral to dietitian nutritionist in regard to the increased cholesterol level from your baseline. Agreed to stop  Ice cream butter creams etc    Processed foods sugars etc.  Continue lifestyle intervention healthy eating and exercise .   plan repeat lipids( along with other labs; hg electrophoresis, ferritin, vit d and celiac panel )   In about 3 months  And decide on medication depending on results.  Can skip Ov if doing better with albs   Get your colonoscopy          Why follow it? Research shows. . Those who follow the Mediterranean diet have a reduced risk of heart disease  . The diet is associated with a reduced incidence of Parkinson's and Alzheimer's diseases . People following the diet may have longer life expectancies and lower rates of chronic diseases  . The Dietary Guidelines for Americans recommends the Mediterranean diet as an eating plan to promote health and prevent disease  What Is the Mediterranean Diet?  . Healthy eating plan based on typical foods and recipes of Mediterranean-style cooking . The diet is primarily a plant based diet; these foods should make up a majority of meals   Starches - Plant based foods should make up a majority of meals - They are an important sources of vitamins, minerals, energy, antioxidants, and fiber - Choose whole grains, foods high in fiber and minimally processed items  - Typical grain sources include wheat, oats, barley, corn, brown rice, bulgar, farro, millet, polenta, couscous  - Various types of beans include chickpeas, lentils, fava beans, black beans, white beans   Fruits  Veggies - Large quantities of antioxidant rich fruits & veggies; 6 or more servings  - Vegetables can be eaten raw or lightly drizzled with oil and cooked  - Vegetables common to the traditional Mediterranean Diet include: artichokes, arugula, beets, broccoli, brussel sprouts, cabbage, carrots, celery, collard greens, cucumbers, eggplant, kale,  leeks, lemons, lettuce, mushrooms, okra, onions, peas, peppers, potatoes, pumpkin, radishes, rutabaga, shallots, spinach, sweet potatoes, turnips, zucchini - Fruits common to the Mediterranean Diet include: apples, apricots, avocados, cherries, clementines, dates, figs, grapefruits, grapes, melons, nectarines, oranges, peaches, pears, pomegranates, strawberries, tangerines  Fats - Replace butter and margarine with healthy oils, such as olive oil, canola oil, and tahini  - Limit nuts to no more than a handful a day  - Nuts include walnuts, almonds, pecans, pistachios, pine nuts  - Limit or avoid candied, honey roasted or heavily salted nuts - Olives are central to the [employer] - can be eaten whole or used in a variety of dishes   Meats Protein - Limiting red meat: no more than a few times a month - When eating red meat: choose lean cuts and keep the portion to the size of deck of cards - Eggs: approx. 0 to 4 times a week  - Fish and lean poultry: at least 2 a week  - Healthy protein sources include, chicken, [place], lean beef, lamb - Increase intake of seafood such as tuna, salmon, trout, mackerel, shrimp, scallops - Avoid or limit high fat processed meats such as sausage and bacon  Dairy - Include moderate amounts of low fat dairy products  - Focus on healthy dairy such as fat free yogurt, skim milk, low or reduced fat cheese - Limit dairy products higher in fat such as whole or 2% milk, cheese, ice cream  Alcohol - Moderate amounts of red wine is ok  - No more  than 5 oz daily for women (all ages) and men older than age 47  - No more than 10 oz of wine daily for men younger than 82  Other - Limit sweets and other desserts  - Use herbs and spices instead of salt to flavor foods  - Herbs and spices common to the traditional Mediterranean Diet include: basil, bay leaves, chives, cloves, cumin, fennel, garlic, lavender, marjoram, mint, oregano, parsley, pepper, rosemary, sage, savory,  sumac, tarragon, thyme   It's not just a diet, it's a lifestyle:  . The Mediterranean diet includes lifestyle factors typical of those in the region  . Foods, drinks and meals are best eaten with others and savored . Daily physical activity is important for overall good health . This could be strenuous exercise like running and aerobics . This could also be more leisurely activities such as walking, housework, yard-work, or taking the stairs . Moderation is the key; a balanced and healthy diet accommodates most foods and drinks . Consider portion sizes and frequency of consumption of certain foods   Meal Ideas & Options:  . Breakfast:  o Whole wheat toast or whole wheat English muffins with peanut butter & hard boiled egg o Steel cut oats topped with apples & cinnamon and skim milk  o Fresh fruit: banana, strawberries, melon, berries, peaches  o Smoothies: strawberries, bananas, greek yogurt, peanut butter o Low fat greek yogurt with blueberries and granola  o Egg white omelet with spinach and mushrooms o Breakfast couscous: whole wheat couscous, apricots, skim milk, cranberries  . Sandwiches:  o Hummus and grilled vegetables (peppers, zucchini, squash) on whole wheat bread   o Grilled chicken on whole wheat pita with lettuce, tomatoes, cucumbers or tzatziki  o Tuna salad on whole wheat bread: tuna salad made with greek yogurt, olives, red peppers, capers, green onions o Garlic rosemary lamb pita: lamb sauted with garlic, rosemary, salt & pepper; add lettuce, cucumber, greek yogurt to pita - flavor with lemon juice and black pepper  . Seafood:  o Mediterranean grilled salmon, seasoned with garlic, basil, parsley, lemon juice and black pepper o Shrimp, lemon, and spinach whole-grain pasta salad made with low fat greek yogurt  o Seared scallops with lemon orzo  o Seared tuna steaks seasoned salt, pepper, coriander topped with tomato mixture of olives, tomatoes, olive oil, minced garlic,  parsley, green onions and cappers  . Meats:  o Herbed greek chicken salad with kalamata olives, cucumber, feta  o Red bell peppers stuffed with spinach, bulgur, lean ground beef (or lentils) & topped with feta   o Kebabs: skewers of chicken, tomatoes, onions, zucchini, squash  o [place] burgers: made with red onions, mint, dill, lemon juice, feta cheese topped with roasted red peppers . Vegetarian o Cucumber salad: cucumbers, artichoke hearts, celery, red onion, feta cheese, tossed in olive oil & lemon juice  o Hummus and whole grain pita points with a greek salad (lettuce, tomato, feta, olives, cucumbers, red onion) o Lentil soup with celery, carrots made with vegetable broth, garlic, salt and pepper  o Tabouli salad: parsley, bulgur, mint, scallions, cucumbers, tomato, radishes, lemon juice, olive oil, salt and pepper.

## 2016-01-19 NOTE — Assessment & Plan Note (Addendum)
Stable  Ever since she can remember   No hg electro in record   And post memopausal taking iron  Get ferritin and hg electrophoresis celiac panel When getting her lipid fu

## 2016-01-19 NOTE — Assessment & Plan Note (Signed)
Had hdl 107 in past and now down ot 70 though eating better ?? ldl over 200  ? fam hx   Mom had mild mi in her 39s not aware of high lipids in family  Nutrition referral lsi and repeat in 3 months

## 2016-01-20 ENCOUNTER — Encounter: Payer: Self-pay | Admitting: Adult Health

## 2016-01-20 NOTE — Telephone Encounter (Signed)
Pt has not been seen in a year, requesting a letter and recommendation for oral appliance. RA please advise on recs.  Thanks!

## 2016-01-20 NOTE — Telephone Encounter (Signed)
Please off for office visit to discuss further She would be suitable for oral appliance Okay to give a letter stating that she has mild OSA and did not tolerate CPAP and would be a good candidate for dental appliance  Does she have a dentist?

## 2016-01-22 ENCOUNTER — Encounter: Payer: Self-pay | Admitting: Pulmonary Disease

## 2016-01-22 ENCOUNTER — Ambulatory Visit (INDEPENDENT_AMBULATORY_CARE_PROVIDER_SITE_OTHER): Payer: 59 | Admitting: Pulmonary Disease

## 2016-01-22 DIAGNOSIS — G4733 Obstructive sleep apnea (adult) (pediatric): Secondary | ICD-10-CM | POA: Diagnosis not present

## 2016-01-22 DIAGNOSIS — R0683 Snoring: Secondary | ICD-10-CM | POA: Diagnosis not present

## 2016-01-22 NOTE — Patient Instructions (Signed)
Agree with dental evaluation and mandibular device Letter given Please call us once devices adjusted for repeat home sleep study

## 2016-01-22 NOTE — Assessment & Plan Note (Signed)
Trial of nasal steroids

## 2016-01-22 NOTE — Assessment & Plan Note (Signed)
Proceed with assessment for mandibular device She would need repeat home sleep study after devices adjusted We discussed surgical treatment option-I would not recommend this

## 2016-01-22 NOTE — Progress Notes (Signed)
   Subjective:    Patient ID: Andrea Ortiz, female    DOB: 01/18/1961, 55 y.o.   MRN: [id number]  HPI  55 yo female seen for sleep consult 07/16/14 for sleep issues with snoring and gasping episodes during sleep .   HST on 08/29/14 that showed mild OSA with AHI 11 /h   01/22/2016  Chief Complaint  Patient presents with  . Sleep Apnea    Not currently using anything to treat OSA at this time.   Continues to have snoring and gasping during sleep and excessive daytime fatigue  We discussed her sleep study results she had mild OSA she tried using CPAP average 8 cm but just could not tolerate it She is ready to trial dental device, but her local dentist does not do this. She travels to [place] that quite often and her husband's friend is a Pharmacist, community who will help her with the dental device. She needs a letter for insurance purposes today    Previously on trazodone during alcohol rehab which seemed to  Worsen symptoms. She has stopped this but snoring persists. She has snored loudly for years.   Review of Systems neg for any significant sore throat, dysphagia, itching, sneezing, nasal congestion or excess/ purulent secretions, fever, chills, sweats, unintended wt loss, pleuritic or exertional cp, hempoptysis, orthopnea pnd or change in chronic leg swelling. Also denies presyncope, palpitations, heartburn, abdominal pain, nausea, vomiting, diarrhea or change in bowel or urinary habits, dysuria,hematuria, rash, arthralgias, visual complaints, headache, numbness weakness or ataxia.     Objective:   Physical Exam   Gen. Pleasant, well-nourished,thin , in no distress ENT - no lesions, no post nasal drip Neck: No JVD, no thyromegaly, no carotid bruits Lungs: no use of accessory muscles, no dullness to percussion, clear without rales or rhonchi  Cardiovascular: Rhythm regular, heart sounds  normal, no murmurs or gallops, no peripheral edema Musculoskeletal: No deformities, no cyanosis or  clubbing         Assessment & Plan:

## 2016-02-09 ENCOUNTER — Other Ambulatory Visit: Payer: Self-pay | Admitting: *Deleted

## 2016-02-09 ENCOUNTER — Ambulatory Visit: Payer: Self-pay | Admitting: Dietician

## 2016-02-09 MED ORDER — GABAPENTIN 300 MG PO CAPS: 300.0000 mg | ORAL_CAPSULE | Freq: Three times a day (TID) | ORAL | 1 refills | Status: DC

## 2016-02-09 MED ORDER — VITAMIN D (ERGOCALCIFEROL) 1.25 MG (50000 UNIT) PO CAPS: ORAL_CAPSULE | ORAL | 0 refills | Status: DC

## 2016-02-09 MED ORDER — MELOXICAM 15 MG PO TABS: 15.0000 mg | ORAL_TABLET | Freq: Every day | ORAL | 1 refills | Status: DC

## 2016-02-10 ENCOUNTER — Other Ambulatory Visit: Payer: Self-pay | Admitting: Family Medicine

## 2016-02-11 NOTE — Telephone Encounter (Signed)
Refill done.  

## 2016-02-25 ENCOUNTER — Other Ambulatory Visit: Payer: Self-pay | Admitting: Internal Medicine

## 2016-02-25 NOTE — Telephone Encounter (Signed)
Sent to the pharmacy by e-scribe. 

## 2016-02-27 ENCOUNTER — Encounter: Payer: Self-pay | Admitting: Dietician

## 2016-02-27 ENCOUNTER — Encounter: Payer: 59 | Attending: Internal Medicine | Admitting: Dietician

## 2016-02-27 DIAGNOSIS — E785 Hyperlipidemia, unspecified: Secondary | ICD-10-CM | POA: Diagnosis present

## 2016-02-27 NOTE — Progress Notes (Signed)
  Medical Nutrition Therapy:  Appt start time: 0800 end time:  0900.   Assessment:  Primary concerns today: Andrea Ortiz is here today referred for high cholesterol. She states she wanted to meet with a dietitian before beginning medication. Has never had high cholesterol in the past and is unsure about family history. 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 She works a full time for an IT consultant and works out 5-6x a week. Usually Pure Barre and walking. Does not walk as much due to back arthritis. Lost 50 pounds on [employer] 11 years ago; content with her current weight. She reports that she practices a low carb, high protein diet; she notes a history of anorexia. [name] reports an improved relationship with food and eats regular meals and snacks. Avoids red meat and rarely eats out.   Preferred Learning Style:   No preference indicated   Learning Readiness:   Ready   MEDICATIONS: see list   DIETARY INTAKE:  Usual eating pattern includes 3 meals and 2-3 snacks per day. Avoided foods include red meat, fried foods, a lot of sweets, okra.    24-hr recall:  B ( AM): 2 Jimmy Dean egg frittatas  Snk ( AM): 2 Laughing Cow cheese  L ( PM): salmon salad Snk ( PM): 100 calorie pack almonds D ( PM): [place] meatloaf OR baked chicken Snk ( PM): low calorie ice cream bar or vanilla frozen yogurt  Beverages: water, Diet Sprite, unsweet tea, sparkling water, no alcohol, coffee with cream and Truvia/Stevia  Usual physical activity: walking and Pure Barre 5-6x a week  Estimated energy needs: 1800-2000 calories  Progress Towards Goal(s):  In progress.   Nutritional Diagnosis:  [place]-2.2 Altered nutrition-related laboratory As related to possible family history of hyperlipidemia.  As evidenced by elevated total cholesterol and LDL.    Intervention:  Nutrition education provided. Explained the effects of a high fiber diet on lipid levels.  Identified high fiber foods and encouraged patient to increase dietary fiber.   Teaching Method Utilized:  Visual Auditory Hands on  Handouts given during visit include:  none  Barriers to learning/adherence to lifestyle change: none  Demonstrated degree of understanding via:  Teach Back   Monitoring/Evaluation:  Dietary intake, exercise, and body weight prn.

## 2016-03-01 ENCOUNTER — Other Ambulatory Visit: Payer: Self-pay | Admitting: Family Medicine

## 2016-03-02 NOTE — Telephone Encounter (Signed)
Refill done.  

## 2016-04-15 ENCOUNTER — Other Ambulatory Visit: Payer: Self-pay | Admitting: Family Medicine

## 2016-04-15 NOTE — Telephone Encounter (Signed)
Refill done.  

## 2016-05-03 HISTORY — PX: COLONOSCOPY: SHX174

## 2016-05-14 ENCOUNTER — Other Ambulatory Visit: Payer: Self-pay | Admitting: Family Medicine

## 2016-05-14 NOTE — Telephone Encounter (Signed)
Refill done.  

## 2016-05-20 ENCOUNTER — Other Ambulatory Visit: Payer: Self-pay | Admitting: Family Medicine

## 2016-05-21 NOTE — Telephone Encounter (Signed)
Refill done.  

## 2016-05-24 ENCOUNTER — Telehealth: Payer: Self-pay | Admitting: Internal Medicine

## 2016-05-24 ENCOUNTER — Encounter: Payer: Self-pay | Admitting: Pulmonary Disease

## 2016-05-24 DIAGNOSIS — Z1211 Encounter for screening for malignant neoplasm of colon: Secondary | ICD-10-CM

## 2016-05-24 DIAGNOSIS — G4733 Obstructive sleep apnea (adult) (pediatric): Secondary | ICD-10-CM

## 2016-05-24 NOTE — Telephone Encounter (Signed)
Referral placed in the system. 

## 2016-05-24 NOTE — Telephone Encounter (Signed)
Pt needs a referral to to gi for colonoscopy due to age.

## 2016-05-24 NOTE — Telephone Encounter (Signed)
Pt is requesting a new home sleep test to be ordered- see patient email.  Ok to order?  Thanks!

## 2016-05-25 ENCOUNTER — Encounter: Payer: Self-pay | Admitting: Gastroenterology

## 2016-05-25 NOTE — Telephone Encounter (Signed)
lmtcb x1 for pt. 

## 2016-05-25 NOTE — Telephone Encounter (Signed)
She had mild OSA on her HST in 2016 I did not receive report from [facility] [place] to order repeat HST What happened with dental device ? She was going to have this made per our last OV

## 2016-05-27 NOTE — Telephone Encounter (Signed)
Pt sent copy of letter from [place] that stated she is indeed using the oral device - called spoke with patient to verify this.  While speaking with patient, did inform her that RA okayed the HST and that the order has been placed.  Pt voiced her understanding.    Will forward to RA so that he may view the letter from [facility].

## 2016-05-27 NOTE — Telephone Encounter (Signed)
Reviewed that her from her dentist Dr. Iran Sizer [phone]  Please let patient know that we will proceed with home sleep test-she needs to wear her device on the night of the test. She will then need an office visit with me to discuss and copy of sleep test will be sent to her dentist.  Device will then be titrated to maximum settings and after final titration, we will repeat an attended polysomnogram in the lab

## 2016-05-27 NOTE — Telephone Encounter (Signed)
Returned call, CB is [phone]

## 2016-06-07 ENCOUNTER — Encounter: Payer: Self-pay | Admitting: Gastroenterology

## 2016-06-07 ENCOUNTER — Encounter: Payer: Self-pay | Admitting: Pulmonary Disease

## 2016-06-07 ENCOUNTER — Ambulatory Visit (AMBULATORY_SURGERY_CENTER): Payer: Self-pay | Admitting: *Deleted

## 2016-06-07 VITALS — Ht 66.0 in | Wt 148.0 lb

## 2016-06-07 DIAGNOSIS — Z1211 Encounter for screening for malignant neoplasm of colon: Secondary | ICD-10-CM

## 2016-06-07 MED ORDER — NA SULFATE-K SULFATE-MG SULF 17.5-3.13-1.6 GM/177ML PO SOLN: ORAL | 0 refills | Status: DC

## 2016-06-07 NOTE — Progress Notes (Signed)
Patient denies any allergies to eggs or soy. Patient denies any problems with anesthesia/sedation. Patient denies any oxygen use at home and does not take any diet/weight loss medications. EMMI education declined by patient. Notified Josh,CRNA of patient's family hx malignant hyperthermia.

## 2016-06-07 NOTE — Progress Notes (Signed)
Pre visit review using our clinic review tool, if applicable. No additional management support is needed unless otherwise documented below in the visit note.  Chief Complaint  Patient presents with  . Scab on Face    X1 month.  Unsure how it got there.  No pain or itching.    HPI: Andrea Ortiz 56 y.o.  sda problem based appt see above  Has had a lesion on her left upper forehead near hairline for over a month. She denies any trauma injury pain bleeding. She states that she put some Neosporin on the top will come off and it would keep growing back. Uncertain if it's an important lesion. History of a lesion removal on the face that I suppose was not cancer years ago. ROS: See pertinent positives and negatives per HPI.  Past Medical History:  Diagnosis Date  . 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 Alcoholism in remission ([place])    doing well  2016   . Chest pain 02/09/2011  . EXANTHEM   . PALPITATIONS, RECURRENT   . PSVT   . Sleep apnea    mouth piece    Family History  Problem Relation Age of Onset  . Alzheimer's disease Mother   . Heart attack Mother     age 80's  . Parkinsonism Father   . Other Sister     Hypoglycemia  . Malignant hyperthermia Brother   . Colon cancer Neg Hx     Social History   Social History  . Marital status: Married    Spouse name: N/A  . Number of children: N/A  . Years of education: N/A   Social History Main Topics  . Smoking status: Former Smoker    Packs/day: 1.50    Years: 15.00    Types: Cigarettes    Quit date: 08/12/1995  . Smokeless tobacco: Never Used  . Alcohol use No     Comment: quit   . Drug use: No  . Sexual activity: Not Asked   Other Topics Concern  . None   Social History Narrative   Divorced- Remarried- Husband has a son at [place]   One child    Dog   Works [employer]      hhof 4  No ets.    Avoids caffiene etoh with dinner.   Exercise 5 d  Per week.    Sleep 6 hours or less       Outpatient Medications Prior to Visit  Medication Sig Dispense Refill  . desoximetasone (TOPICORT) 0.05 % cream Apply topically 2 (two) times daily. For 2 weeks at a time     . Diclofenac Sodium 2 % SOLN Apply 1 pump twice daily. 112 g 3  . DULoxetine (CYMBALTA) 30 MG capsule Take 90 mg by mouth daily.     Marland Kitchen gabapentin (NEURONTIN) 300 MG capsule TAKE 1 CAPSULE BY MOUTH 3  TIMES DAILY 270 capsule 0  . meloxicam (MOBIC) 15 MG tablet TAKE 1 TABLET(15 MG) BY MOUTH DAILY 90 tablet 0  . metoprolol (LOPRESSOR) 50 MG tablet TAKE 1/2 TABLET BY MOUTH TWICE DAILY OR AS DIRECTED 90 tablet 0  . Vitamin D, Ergocalciferol, (DRISDOL) 50000 units CAPS capsule TAKE 1 CAPSULE BY MOUTH EVERY 7 DAYS 12 capsule 0  . Na Sulfate-K Sulfate-Mg Sulf 17.5-3.13-1.6 GM/180ML SOLN Suprep (no substitutions)-TAKE AS DIRECTED. (Patient not taking: Reported on 06/08/2016) 354 mL 0   No facility-administered medications prior to visit.  EXAM:  BP 134/82 (BP Location: Right Arm, Patient Position: Sitting, Cuff Size: Normal)   Temp 97.8 F (36.6 C) (Oral)   Wt 149 lb (67.6 kg)   BMI 24.05 kg/m   Body mass index is 24.05 kg/m.  GENERAL: vitals reviewed and listed above, alert, oriented, appears well hydrated and in no acute distress HEENT: atraumatic, conjunctiva  clear, no obvious abnormalities on inspection of external nose and ears PSYCH: pleasant and cooperative, no obvious depression or anxiety Skin left upper forehead under magnification there is an area of tissue.were off brown raised lesions small but irregular. Nontender no redness. Scaling on the surface.  ASSESSMENT AND PLAN:  Discussed the following assessment and plan:  Pigmented skin lesion of uncertain nature - Plan: Ambulatory referral to Dermatology Lesion of uncertain significance without resolution or injury. I want her to see a dermatologist to check the area for lesion evaluation. We'll send in a referral but she can call also. -Patient  advised to return or notify health care team  if symptoms worsen ,persist or new concerns arise.  Patient Instructions  Would like you to see a dermatologist about the skin lesion. On your forehead  Not certain of the etiology. We can do a dermatology referral you can call also on your own.  [facility]  [address], [place], [place] [address] [phone]     Standley Brooking. Aeryn Medici M.D.

## 2016-06-07 NOTE — Telephone Encounter (Signed)
A home sleep test was ordered on 05/27/16 and the patient has not heard anything regarding getting this scheduled. This will be relayed to the [facility] to check on the scheduling delay. Pt has been notified that we are working on this and we will contact her soon with information.   Please advise Libby. Thanks.

## 2016-06-08 ENCOUNTER — Encounter: Payer: Self-pay | Admitting: Internal Medicine

## 2016-06-08 ENCOUNTER — Ambulatory Visit (INDEPENDENT_AMBULATORY_CARE_PROVIDER_SITE_OTHER): Payer: 59 | Admitting: Internal Medicine

## 2016-06-08 VITALS — BP 134/82 | Temp 97.8°F | Wt 149.0 lb

## 2016-06-08 DIAGNOSIS — L819 Disorder of pigmentation, unspecified: Secondary | ICD-10-CM

## 2016-06-08 NOTE — Patient Instructions (Addendum)
Would like you to see a dermatologist about the skin lesion. On your forehead  Not certain of the etiology. We can do a dermatology referral you can call also on your own.  [facility]  [address], [place], [place] [address] [phone]

## 2016-06-10 NOTE — Telephone Encounter (Signed)
Please advise the status of this Libby. Thanks.

## 2016-06-18 ENCOUNTER — Other Ambulatory Visit: Payer: Self-pay | Admitting: Family Medicine

## 2016-06-21 ENCOUNTER — Encounter: Payer: Self-pay | Admitting: Gastroenterology

## 2016-06-21 ENCOUNTER — Ambulatory Visit (AMBULATORY_SURGERY_CENTER): Payer: 59 | Admitting: Gastroenterology

## 2016-06-21 VITALS — BP 102/68 | HR 77 | Temp 97.3°F | Resp 20 | Ht 66.0 in | Wt 148.0 lb

## 2016-06-21 DIAGNOSIS — D124 Benign neoplasm of descending colon: Secondary | ICD-10-CM

## 2016-06-21 DIAGNOSIS — Z1211 Encounter for screening for malignant neoplasm of colon: Secondary | ICD-10-CM

## 2016-06-21 DIAGNOSIS — D126 Benign neoplasm of colon, unspecified: Secondary | ICD-10-CM | POA: Diagnosis not present

## 2016-06-21 DIAGNOSIS — K635 Polyp of colon: Secondary | ICD-10-CM

## 2016-06-21 DIAGNOSIS — Z1212 Encounter for screening for malignant neoplasm of rectum: Secondary | ICD-10-CM

## 2016-06-21 DIAGNOSIS — D122 Benign neoplasm of ascending colon: Secondary | ICD-10-CM | POA: Diagnosis not present

## 2016-06-21 DIAGNOSIS — D120 Benign neoplasm of cecum: Secondary | ICD-10-CM

## 2016-06-21 DIAGNOSIS — D125 Benign neoplasm of sigmoid colon: Secondary | ICD-10-CM | POA: Diagnosis not present

## 2016-06-21 DIAGNOSIS — D123 Benign neoplasm of transverse colon: Secondary | ICD-10-CM

## 2016-06-21 MED ORDER — SODIUM CHLORIDE 0.9 % IV SOLN: 500.0000 mL | INTRAVENOUS | Status: DC

## 2016-06-21 NOTE — Progress Notes (Signed)
Called to room to assist during endoscopic procedure.  Patient ID and intended procedure confirmed with present staff. Received instructions for my participation in the procedure from the performing physician.  

## 2016-06-21 NOTE — Telephone Encounter (Signed)
Refill done.  

## 2016-06-21 NOTE — Patient Instructions (Signed)
YOU HAD AN ENDOSCOPIC PROCEDURE TODAY AT [facility]:   Refer to the procedure report that was given to you for any specific questions about what was found during the examination.  If the procedure report does not answer your questions, please call your gastroenterologist to clarify.  If you requested that your care partner not be given the details of your procedure findings, then the procedure report has been included in a sealed envelope for you to review at your convenience later.  YOU SHOULD EXPECT: Some feelings of bloating in the abdomen. Passage of more gas than usual.  Walking can help get rid of the air that was put into your GI tract during the procedure and reduce the bloating. If you had a lower endoscopy (such as a colonoscopy or flexible sigmoidoscopy) you may notice spotting of blood in your stool or on the toilet paper. If you underwent a bowel prep for your procedure, you may not have a normal bowel movement for a few days.  Please Note:  You might notice some irritation and congestion in your nose or some drainage.  This is from the oxygen used during your procedure.  There is no need for concern and it should clear up in a day or so.  SYMPTOMS TO REPORT IMMEDIATELY:   Following lower endoscopy (colonoscopy or flexible sigmoidoscopy):  Excessive amounts of blood in the stool  Significant tenderness or worsening of abdominal pains  Swelling of the abdomen that is new, acute  Fever of 100F or higher   For urgent or emergent issues, a gastroenterologist can be reached at any hour by calling [phone].   DIET:  We do recommend a small meal at first, but then you may proceed to your regular diet.  Drink plenty of fluids but you should avoid alcoholic beverages for 24 hours.  ACTIVITY:  You should plan to take it easy for the rest of today and you should NOT DRIVE or use heavy machinery until tomorrow (because of the sedation medicines used during the test).     FOLLOW UP: Our staff will call the number listed on your records the next business day following your procedure to check on you and address any questions or concerns that you may have regarding the information given to you following your procedure. If we do not reach you, we will leave a message.  However, if you are feeling well and you are not experiencing any problems, there is no need to return our call.  We will assume that you have returned to your regular daily activities without incident.  If any biopsies were taken you will be contacted by phone or by letter within the next 1-3 weeks.  Please call us at [phone] if you have not heard about the biopsies in 3 weeks.    SIGNATURES/CONFIDENTIALITY: You and/or your care partner have signed paperwork which will be entered into your electronic medical record.  These signatures attest to the fact that that the information above on your After Visit Summary has been reviewed and is understood.  Full responsibility of the confidentiality of this discharge information lies with you and/or your care-partner.  No NSAIDS for 2 weeks ie: aspirin,aleve, ibuprofen due to bleeding risk.  Read all handouts given to you by your recovery room nurse.

## 2016-06-21 NOTE — Op Note (Signed)
[facility] Patient Name: Andrea Ortiz Procedure Date: 06/21/2016 9:02 AM MRN: [id number] Endoscopist: Remo Lipps P. Armbruster MD, MD Age: 56 Referring MD:  Date of Birth: 12-22-1960 Gender: Female Account #: [id number] Procedure:                Colonoscopy Indications:              Screening for malignant neoplasm in the colon, This                            is the patient's first colonoscopy Medicines:                Monitored Anesthesia Care Procedure:                Pre-Anesthesia Assessment:                           - Prior to the procedure, a History and Physical                            was performed, and patient medications and                            allergies were reviewed. The patient's tolerance of                            previous anesthesia was also reviewed. The risks                            and benefits of the procedure and the sedation                            options and risks were discussed with the patient.                            All questions were answered, and informed consent                            was obtained. Prior Anticoagulants: The patient has                            taken no previous anticoagulant or antiplatelet                            agents. ASA Grade Assessment: II - A patient with                            mild systemic disease. After reviewing the risks                            and benefits, the patient was deemed in                            satisfactory condition to undergo the procedure.  After obtaining informed consent, the colonoscope                            was passed under direct vision. Throughout the                            procedure, the patient's blood pressure, pulse, and                            oxygen saturations were monitored continuously. The                            Model PCF-H190L [id number]) scope was introduced                            through the  anus and advanced to the the cecum,                            identified by appendiceal orifice and ileocecal                            valve. The colonoscopy was performed without                            difficulty. The patient tolerated the procedure                            well. The quality of the bowel preparation was                            adequate. The ileocecal valve, appendiceal orifice,                            and rectum were photographed. Scope In: 9:14:33 AM Scope Out: 9:50:54 AM Scope Withdrawal Time: 0 hours 27 minutes 40 seconds  Total Procedure Duration: 0 hours 36 minutes 21 seconds  Findings:                 The perianal and digital rectal examinations were                            normal.                           A 4 mm polyp was found in the cecum. The polyp was                            sessile. The polyp was removed with a cold snare.                            Resection and retrieval were complete.                           Three sessile polyps were found in the ascending  colon. The polyps were 4 to 7 mm in size. These                            polyps were removed with a cold snare. Resection                            and retrieval were complete.                           Two sessile polyps were found in the hepatic                            flexure. The polyps were 4 to 8 mm in size. These                            polyps were removed with a cold snare. Resection                            and retrieval were complete.                           Three sessile polyps were found in the transverse                            colon. The polyps were 3 to 5 mm in size. These                            polyps were removed with a cold snare. Resection                            and retrieval were complete.                           A 5 mm polyp was found in the splenic flexure. The                            polyp was sessile.  The polyp was removed with a                            cold snare. Resection and retrieval were complete.                           Two sessile polyps were found in the descending                            colon. The polyps were 3 to 5 mm in size. These                            polyps were removed with a cold snare. Resection                            and retrieval were complete.  Two sessile polyps were found in the sigmoid colon.                            The polyps were 3 to 5 mm in size. These polyps                            were removed with a cold snare. Resection and                            retrieval were complete.                           There was a significant amount of residual liquid                            stool in the colon. Several minutes were spent                            lavaging in multiple areas to achieve adequate                            views. The exam was otherwise without abnormality                            on direct and retroflexion views. Complications:            No immediate complications. Estimated blood loss:                            Minimal. Estimated Blood Loss:     Estimated blood loss was minimal. Impression:               - One 4 mm polyp in the cecum, removed with a cold                            snare. Resected and retrieved.                           - Three 4 to 7 mm polyps in the ascending colon,                            removed with a cold snare. Resected and retrieved.                           - Two 4 to 8 mm polyps at the hepatic flexure,                            removed with a cold snare. Resected and retrieved.                           - Three 3 to 5 mm polyps in the transverse colon,  removed with a cold snare. Resected and retrieved.                           - One 5 mm polyp at the splenic flexure, removed                            with a cold snare. Resected  and retrieved.                           - Two 3 to 5 mm polyps in the descending colon,                            removed with a cold snare. Resected and retrieved.                           - Two 3 to 5 mm polyps in the sigmoid colon,                            removed with a cold snare. Resected and retrieved.                           - Residual stool in the colon leading to several                            minutes of lavage to obtain adequate views for this                            screening exam.                           - The examination was otherwise normal on direct                            and retroflexion views. Recommendation:           - Patient has a contact number available for                            emergencies. The signs and symptoms of potential                            delayed complications were discussed with the                            patient. Return to normal activities tomorrow.                            Written discharge instructions were provided to the                            patient.                           - Resume previous diet.                           -  Continue present medications.                           - No ibuprofen, naproxen, or other non-steroidal                            anti-inflammatory drugs for 2 weeks after polyp                            removal.                           - Await pathology results.                           - Repeat colonoscopy is recommended for                            surveillance. The colonoscopy date will be                            determined after pathology results from today's                            exam become available for review. Remo Lipps P. Armbruster MD, MD 06/21/2016 9:58:00 AM This report has been signed electronically.

## 2016-06-22 ENCOUNTER — Telehealth: Payer: Self-pay

## 2016-06-22 ENCOUNTER — Telehealth: Payer: Self-pay | Admitting: *Deleted

## 2016-06-22 NOTE — Telephone Encounter (Signed)
Left message on answering machine. 

## 2016-06-22 NOTE — Telephone Encounter (Signed)
  Follow up Call-  Call back number 06/21/2016  Post procedure Call Back phone  # [phone]  Permission to leave phone message Yes  Some recent data might be hidden   [facility]

## 2016-06-25 ENCOUNTER — Encounter: Payer: Self-pay | Admitting: Gastroenterology

## 2016-06-25 ENCOUNTER — Encounter: Payer: Self-pay | Admitting: Internal Medicine

## 2016-06-29 DIAGNOSIS — G4733 Obstructive sleep apnea (adult) (pediatric): Secondary | ICD-10-CM | POA: Diagnosis not present

## 2016-07-05 ENCOUNTER — Telehealth: Payer: Self-pay | Admitting: Pulmonary Disease

## 2016-07-05 DIAGNOSIS — G4733 Obstructive sleep apnea (adult) (pediatric): Secondary | ICD-10-CM | POA: Diagnosis not present

## 2016-07-05 NOTE — Telephone Encounter (Signed)
Per Dr. Elsworth Soho, HST shows OSA has improved. Did she use an oral device during study?

## 2016-07-07 ENCOUNTER — Other Ambulatory Visit: Payer: Self-pay | Admitting: *Deleted

## 2016-07-07 DIAGNOSIS — G4733 Obstructive sleep apnea (adult) (pediatric): Secondary | ICD-10-CM

## 2016-07-07 NOTE — Telephone Encounter (Signed)
ok 

## 2016-07-07 NOTE — Telephone Encounter (Signed)
Spoke with pt about her results, she was wearing her oral device during study. She has a follow up appointment on 3/22.  Rigoberto Noel, MD      3:08 PM  Note    Reviewed that her from her dentist Dr. Iran Sizer [phone]  Please let patient know that we will proceed with home sleep test-she needs to wear her device on the night of the test. She will then need an office visit with me to discuss and copy of sleep test will be sent to her dentist.  Device will then be titrated to maximum settings and after final titration, we will repeat an attended polysomnogram in the lab

## 2016-07-08 ENCOUNTER — Other Ambulatory Visit: Payer: Self-pay | Admitting: Internal Medicine

## 2016-07-15 ENCOUNTER — Other Ambulatory Visit: Payer: Self-pay | Admitting: Internal Medicine

## 2016-07-15 MED ORDER — METOPROLOL TARTRATE 50 MG PO TABS: ORAL_TABLET | ORAL | 1 refills | Status: DC

## 2016-07-22 ENCOUNTER — Ambulatory Visit (INDEPENDENT_AMBULATORY_CARE_PROVIDER_SITE_OTHER): Payer: 59 | Admitting: Pulmonary Disease

## 2016-07-22 ENCOUNTER — Encounter: Payer: Self-pay | Admitting: Pulmonary Disease

## 2016-07-22 DIAGNOSIS — G4733 Obstructive sleep apnea (adult) (pediatric): Secondary | ICD-10-CM

## 2016-07-22 NOTE — Progress Notes (Signed)
   Subjective:    Patient ID: Andrea Ortiz, female    DOB: 1961-03-29, 56 y.o.   MRN: [id number]  HPI  56 yo Woman for follow-up of mild OSA  HST on 08/2014  AHI 11 /h   She had an oral appliance made from a dentist in [place], mandibular advancement type  We repeated her sleep study after maximum advancement and AHI had decreased to 4/hour.  She is sleeping well and denies any headaches, jaw pain or bite issues Snoring has decreased   Review of Systems Patient denies significant dyspnea,cough, hemoptysis,  chest pain, palpitations, pedal edema, orthopnea, paroxysmal nocturnal dyspnea, lightheadedness, nausea, vomiting, abdominal or  leg pains      Objective:   Physical Exam   Gen. Pleasant, well-nourished, in no distress ENT - no lesions, no post nasal drip Neck: No JVD, no thyromegaly, no carotid bruits Lungs: no use of accessory muscles, no dullness to percussion, clear without rales or rhonchi  Cardiovascular: Rhythm regular, heart sounds  normal, no murmurs or gallops, no peripheral edema Musculoskeletal: No deformities, no cyanosis or clubbing         Assessment & Plan:

## 2016-07-22 NOTE — Assessment & Plan Note (Signed)
She had significant improvement with mandibular advancement device with decrease in her AHI to 4/hour in subjective improvement in snoring and her sleep  Compliance was emphasized. , And adverse effects of mandibular advancement were discussed  She will follow-up on an as-needed basis

## 2016-08-10 ENCOUNTER — Other Ambulatory Visit: Payer: Self-pay | Admitting: Family Medicine

## 2016-09-03 ENCOUNTER — Ambulatory Visit: Payer: Self-pay | Admitting: Family Medicine

## 2016-09-04 ENCOUNTER — Other Ambulatory Visit: Payer: Self-pay | Admitting: Family Medicine

## 2016-09-06 ENCOUNTER — Other Ambulatory Visit: Payer: Self-pay | Admitting: Internal Medicine

## 2016-09-08 DIAGNOSIS — D485 Neoplasm of uncertain behavior of skin: Secondary | ICD-10-CM | POA: Diagnosis not present

## 2016-09-08 DIAGNOSIS — C44329 Squamous cell carcinoma of skin of other parts of face: Secondary | ICD-10-CM | POA: Diagnosis not present

## 2016-09-14 ENCOUNTER — Other Ambulatory Visit: Payer: Self-pay | Admitting: Family Medicine

## 2016-10-21 DIAGNOSIS — C44329 Squamous cell carcinoma of skin of other parts of face: Secondary | ICD-10-CM | POA: Diagnosis not present

## 2016-10-21 DIAGNOSIS — L905 Scar conditions and fibrosis of skin: Secondary | ICD-10-CM | POA: Diagnosis not present

## 2016-11-15 ENCOUNTER — Other Ambulatory Visit: Payer: Self-pay | Admitting: Internal Medicine

## 2016-11-15 ENCOUNTER — Other Ambulatory Visit: Payer: Self-pay | Admitting: Family Medicine

## 2016-11-15 NOTE — Telephone Encounter (Signed)
Left a VM for patient to give the office a call back in regards to which pharmacy she is using

## 2016-11-16 DIAGNOSIS — D225 Melanocytic nevi of trunk: Secondary | ICD-10-CM | POA: Diagnosis not present

## 2016-11-16 DIAGNOSIS — L821 Other seborrheic keratosis: Secondary | ICD-10-CM | POA: Diagnosis not present

## 2016-11-16 DIAGNOSIS — L728 Other follicular cysts of the skin and subcutaneous tissue: Secondary | ICD-10-CM | POA: Diagnosis not present

## 2016-11-16 DIAGNOSIS — D485 Neoplasm of uncertain behavior of skin: Secondary | ICD-10-CM | POA: Diagnosis not present

## 2016-11-16 DIAGNOSIS — L814 Other melanin hyperpigmentation: Secondary | ICD-10-CM | POA: Diagnosis not present

## 2016-11-16 NOTE — Telephone Encounter (Signed)
Left a VM for a patient to give the office a call back.

## 2016-11-19 NOTE — Telephone Encounter (Signed)
Patient is coming up for an annual. Sent in blood pressure medication  to pharmacy. Please help patient schedule and appointment. Thank you

## 2016-11-19 NOTE — Telephone Encounter (Signed)
Pt is not near a calendar and will callback on  monday

## 2016-12-04 ENCOUNTER — Other Ambulatory Visit: Payer: Self-pay | Admitting: Family Medicine

## 2016-12-06 ENCOUNTER — Other Ambulatory Visit: Payer: Self-pay | Admitting: Family Medicine

## 2016-12-06 NOTE — Telephone Encounter (Signed)
Pt has not been seen in over a year. Refill denied.  

## 2016-12-06 NOTE — Telephone Encounter (Signed)
Refill denied. Pt has not been seen within a year.  

## 2016-12-31 DIAGNOSIS — Z124 Encounter for screening for malignant neoplasm of cervix: Secondary | ICD-10-CM | POA: Diagnosis not present

## 2016-12-31 DIAGNOSIS — Z01419 Encounter for gynecological examination (general) (routine) without abnormal findings: Secondary | ICD-10-CM | POA: Diagnosis not present

## 2016-12-31 DIAGNOSIS — Z1231 Encounter for screening mammogram for malignant neoplasm of breast: Secondary | ICD-10-CM | POA: Diagnosis not present

## 2017-01-02 ENCOUNTER — Other Ambulatory Visit: Payer: Self-pay | Admitting: Family Medicine

## 2017-01-06 ENCOUNTER — Telehealth: Payer: Self-pay | Admitting: Family Medicine

## 2017-01-06 NOTE — Telephone Encounter (Signed)
Patient has appointment scheduled for Monday.  Patient states she is having difficulty walking.  She would like to get in sooner with Dr. Tamala Julian if possible.

## 2017-01-06 NOTE — Telephone Encounter (Signed)
Lets add her on tomorrow.

## 2017-01-07 ENCOUNTER — Ambulatory Visit (INDEPENDENT_AMBULATORY_CARE_PROVIDER_SITE_OTHER): Payer: 59 | Admitting: Family Medicine

## 2017-01-07 ENCOUNTER — Encounter: Payer: Self-pay | Admitting: Family Medicine

## 2017-01-07 ENCOUNTER — Ambulatory Visit (INDEPENDENT_AMBULATORY_CARE_PROVIDER_SITE_OTHER)
Admission: RE | Admit: 2017-01-07 | Discharge: 2017-01-07 | Disposition: A | Discharge status: Home / Self Care | Payer: 59 | Source: Ambulatory Visit | Attending: Family Medicine | Admitting: Family Medicine

## 2017-01-07 VITALS — BP 110/78 | HR 68 | Ht 66.0 in | Wt 154.0 lb

## 2017-01-07 DIAGNOSIS — M999 Biomechanical lesion, unspecified: Secondary | ICD-10-CM

## 2017-01-07 DIAGNOSIS — M25552 Pain in left hip: Secondary | ICD-10-CM

## 2017-01-07 DIAGNOSIS — R1032 Left lower quadrant pain: Secondary | ICD-10-CM | POA: Diagnosis not present

## 2017-01-07 MED ORDER — PREDNISONE 50 MG PO TABS: 50.0000 mg | ORAL_TABLET | Freq: Every day | ORAL | 0 refills | Status: DC

## 2017-01-07 NOTE — Assessment & Plan Note (Signed)
Decision today to treat with OMT was based on Physical Exam  After verbal consent patient was treated with HVLA, ME, FPR techniques in cervical, thoracic, lumbar and sacral areas  Patient tolerated the procedure well with improvement in symptoms  Patient given exercises, stretches and lifestyle modifications  See medications in patient instructions if given  Patient will follow up in 2 weeks

## 2017-01-07 NOTE — Assessment & Plan Note (Signed)
Patient doesn't more of a left groin pain. Discussed with patient at great length. We discussed icing regimen and home exercises. We discussed objective is a doing which ones to avoid. X-rays ordered today. Differential includes arthritis, tendinitis, or labral pathology. Patient and will come back and see me again in 2 weeks. Worsening symptoms consider other treatment options.

## 2017-01-07 NOTE — Telephone Encounter (Signed)
Please ask patient if she can come in today at 9:30am.

## 2017-01-07 NOTE — Patient Instructions (Signed)
Good to see you  Xray downstair Prednisone daily for 5 days.  Ice 20 minutes 2 times daily. Usually after activity and before bed. Change up workout a bit, go to [employer] 2 times a week and weights or biking 2 times a week  See me again in 2 weeks.

## 2017-01-07 NOTE — Telephone Encounter (Signed)
Spoke with pt. She will be here at 9:30.

## 2017-01-07 NOTE — Progress Notes (Signed)
[facility] [address], [name] [address] Phone: [phone] Subjective:    I'm seeing this patient by the request  of:    CC: Left hip pain  NID:[id number]  Andrea Ortiz is a 56 y.o. female coming in with complaint of left hip pain. She notes that it has been sore for a while. Yesterday she suddenly was unable to walk as she had intense stabbing pain. She points to her greater trochanter. She notes that she was wearing high heels yesterday.   Her cervical spine is feeling much better.  Onset- yesterday Location- left hip Duration- constant Character- sharp Aggravating factors- weight bearing Reliving factors-  Therapies tried-  Severity- 6/10     Past Medical History:  Diagnosis Date  . 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 Alcoholism in remission ([place])    doing well  2016   . Chest pain 02/09/2011  . EXANTHEM   . PALPITATIONS, RECURRENT   . PSVT   . Sleep apnea    mouth piece   Past Surgical History:  Procedure Laterality Date  . RHINOPLASTY  1980   Social History   Social History  . Marital status: Married    Spouse name: Andrea Ortiz  . Number of children: Andrea Ortiz  . Years of education: Andrea Ortiz   Social History Main Topics  . Smoking status: Former Smoker    Packs/day: 1.50    Years: 15.00    Types: Cigarettes    Quit date: 08/12/1995  . Smokeless tobacco: Never Used  . Alcohol use No     Comment: quit   . Drug use: No  . Sexual activity: Not Asked   Other Topics Concern  . None   Social History Narrative   Divorced- Remarried- Husband has a son at [place]   One child    Dog   Works [employer]      hhof 4  No ets.    Avoids caffiene etoh with dinner.   Exercise 5 d  Per week.    Sleep 6 hours or less    Allergies  Allergen Reactions  . Escitalopram Oxalate Swelling    hands swelled   Family History  Problem Relation Age of Onset  . Alzheimer's disease Mother   . Heart attack  Mother        age 86's  . Parkinsonism Father   . Other Sister        Hypoglycemia  . Malignant hyperthermia Brother   . Colon cancer Neg Hx      Past medical history, social, surgical and family history all reviewed in electronic medical record.  No pertanent information unless stated regarding to the chief complaint.   Review of Systems:Review of systems updated and as accurate as of 01/07/17  No headache, visual changes, nausea, vomiting, diarrhea, constipation, dizziness, abdominal pain, skin rash, fevers, chills, night sweats, weight loss, swollen lymph nodes, body aches, joint swelling chest pain, shortness of breath, mood changes. Positive muscle aches  Objective  Blood pressure 110/78, pulse 68, height 5\' 6"  (1.676 m), weight 154 lb (69.9 kg), SpO2 96 %. Systems examined below as of 01/07/17   General: No apparent distress alert and oriented x3 mood and affect normal, dressed appropriately.  HEENT: Pupils equal, extraocular movements intact  Respiratory: Patient's speak in full sentences and does not appear short of breath  Cardiovascular: No lower extremity edema, non tender, no erythema  Skin: Warm dry intact  with no signs of infection or rash on extremities or on axial skeleton.  Abdomen: Soft nontender  Neuro: Cranial nerves II through XII are intact, neurovascularly intact in all extremities with 2+ DTRs and 2+ pulses.  Lymph: No lymphadenopathy of posterior or anterior cervical chain or axillae bilaterally.  Gait Antalgic gait MSK:  Non tender with full range of motion and good stability and symmetric strength and tone of shoulders, elbows, wrist, knee and ankles bilaterally.  Hip: Left  ROM IR: 15 Deg with worsening pain ER: 45 Deg, Flexion: 120 Deg, Extension: 100 Deg, Abduction: 45 Deg, Adduction: 45 Deg Strength IR: 5/5, ER: 5/5, Flexion: 5/5, Extension: 5/5, Abduction: 5/5, Adduction: 5/5 Pelvic alignment unremarkable to inspection and palpation. Standing hip  rotation and gait without trendelenburg sign / unsteadiness. Greater trochanter without tenderness to palpation. No tenderness over piriformis and greater trochanter. Severe pain with internal range of motion and internal rotation Mild left-sided sacroiliac joint Contralateral hip unremarkable.  Osteopathic findings C2 flexed rotated and side bent right C4 flexed rotated and side bent left C6 flexed rotated and side bent left T3 extended rotated and side bent right inhaled third rib T9 extended rotated and side bent left L2 flexed rotated and side bent right Sacrum right on right Pelvic shear noted    Impression and Recommendations:     This case required medical decision making of moderate complexity.      Note: This dictation was prepared with Dragon dictation along with smaller phrase technology. Any transcriptional errors that result from this process are unintentional.

## 2017-01-10 ENCOUNTER — Ambulatory Visit: Payer: Self-pay | Admitting: Family Medicine

## 2017-01-21 ENCOUNTER — Encounter: Payer: Self-pay | Admitting: Family Medicine

## 2017-01-21 ENCOUNTER — Other Ambulatory Visit: Payer: Self-pay | Admitting: Family Medicine

## 2017-01-21 ENCOUNTER — Ambulatory Visit (INDEPENDENT_AMBULATORY_CARE_PROVIDER_SITE_OTHER): Payer: 59 | Admitting: Family Medicine

## 2017-01-21 ENCOUNTER — Ambulatory Visit: Payer: Self-pay

## 2017-01-21 ENCOUNTER — Encounter: Payer: Self-pay | Admitting: Internal Medicine

## 2017-01-21 VITALS — BP 100/72 | HR 46 | Ht 66.5 in | Wt 145.0 lb

## 2017-01-21 DIAGNOSIS — M25552 Pain in left hip: Secondary | ICD-10-CM | POA: Diagnosis not present

## 2017-01-21 DIAGNOSIS — M24552 Contracture, left hip: Secondary | ICD-10-CM | POA: Diagnosis not present

## 2017-01-21 MED ORDER — NITROGLYCERIN 0.2 MG/HR TD PT24: MEDICATED_PATCH | TRANSDERMAL | 1 refills | Status: DC

## 2017-01-21 NOTE — Assessment & Plan Note (Signed)
Patient doesn't more of a hip flexor tendinitis. We discussed icing regimen and home exercise. Started education and warned of potential side effects. There is a differential that includes a labral pathology. We will continue to monitor. Follow-up again in 4 weeks.

## 2017-01-21 NOTE — Telephone Encounter (Signed)
Refill done.  

## 2017-01-21 NOTE — Progress Notes (Signed)
[name] [address], [place] [address] Phone: [phone] Subjective:    I'm seeing this patient by the request  of:    CC: left hip pain follow up   [id number]  Andrea Ortiz is a 56 y.o. female coming in with complaint of Left hip pain.  Possible stress fracture vs hip flexor tendonitis.  Patient was to do conservative therapy and was started on prednisone. Patient states that the pain in the hip is feeling better. She would like some hip flexor exercises as her hip flexor is tight and she does have some pain.    patient did have a hip x-ray done 01/07/2017 that was independently visualized by me. Showed no significant bony normality  Past Medical History:  Diagnosis Date  . 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 Alcoholism in remission ([place])    doing well  2016   . Chest pain 02/09/2011  . EXANTHEM   . PALPITATIONS, RECURRENT   . PSVT   . Sleep apnea    mouth piece   Past Surgical History:  Procedure Laterality Date  . RHINOPLASTY  1980   Social History   Social History  . Marital status: Married    Spouse name: N/A  . Number of children: N/A  . Years of education: N/A   Social History Main Topics  . Smoking status: Former Smoker    Packs/day: 1.50    Years: 15.00    Types: Cigarettes    Quit date: 08/12/1995  . Smokeless tobacco: Never Used  . Alcohol use No     Comment: quit   . Drug use: No  . Sexual activity: Not Asked   Other Topics Concern  . None   Social History Narrative   Divorced- Remarried- Husband has a son at [place]   One child    Dog   Works [employer]      hhof 4  No ets.    Avoids caffiene etoh with dinner.   Exercise 5 d  Per week.    Sleep 6 hours or less    Allergies  Allergen Reactions  . Escitalopram Oxalate Swelling    hands swelled   Family History  Problem Relation Age of Onset  . Alzheimer's disease Mother   . Heart attack Mother        age  31's  . Parkinsonism Father   . Other Sister        Hypoglycemia  . Malignant hyperthermia Brother   . Colon cancer Neg Hx      Past medical history, social, surgical and family history all reviewed in electronic medical record.  No pertanent information unless stated regarding to the chief complaint.   Review of Systems:Review of systems updated and as accurate as of 01/21/17  No headache, visual changes, nausea, vomiting, diarrhea, constipation, dizziness, abdominal pain, skin rash, fevers, chills, night sweats, weight loss, swollen lymph nodes, body aches, joint swelling, muscle aches, chest pain, shortness of breath, mood changes.   Objective  Blood pressure 100/72, pulse (!) 46, height 5' 6.5" (1.689 m), weight 145 lb (65.8 kg), peak flow 81 L/min. Systems examined below as of 01/21/17   General: No apparent distress alert and oriented x3 mood and affect normal, dressed appropriately.  HEENT: Pupils equal, extraocular movements intact  Respiratory: Patient's speak in full sentences and does not appear short of breath  Cardiovascular: No lower extremity edema, non tender, no erythema  Skin: Warm dry intact with no signs of infection or rash on extremities or on axial skeleton.  Abdomen: Soft nontender  Neuro: Cranial nerves II through XII are intact, neurovascularly intact in all extremities with 2+ DTRs and 2+ pulses.  Lymph: No lymphadenopathy of posterior or anterior cervical chain or axillae bilaterally.  Gait normal with good balance and coordination.  MSK:  Non tender with full range of motion and good stability and symmetric strength and tone of shoulders, elbows, wrist,  knee and ankles bilaterally.  Hip: Left ROM IR: 15 Deg with increasing discomfort, ER: 45 Deg, Flexion: 120 Deg, Extension: 100 Deg, Abduction: 40 Deg, Adduction: 25 Deg Strength IR: 5/5, ER: 5/5, Flexion: 5/5, Extension: 5/5, Abduction: 5/5, Adduction: 5/5 Pelvic alignment unremarkable to inspection and  palpation. Standing hip rotation and gait without trendelenburg sign / unsteadiness. Greater trochanter without tenderness to palpation. No tenderness over piriformis and greater trochanter. Positive pain with internal rotation. Tenderness in the groin area. No SI joint tenderness and normal minimal SI movement. Contralateral hip unremarkable  MSK US performed of: Left hip This study was ordered, performed, and interpreted by Charlann Boxer D.O.  Hip: Patient's left hip at the insertion of the hip flexor tendon that show that patient has a tear noted. Approximate 10% still torn with increasing Doppler flow. Patient also has scar tissue formation. In addition of this the patient does have a small joint effusion and does seem to have some narrowing of the joint.  IMPRESSION:  Hip flexor tendinitis with tear, small joint effusion.     Impression and Recommendations:     This case required medical decision making of moderate complexity.      Note: This dictation was prepared with Dragon dictation along with smaller phrase technology. Any transcriptional errors that result from this process are unintentional.

## 2017-01-21 NOTE — Patient Instructions (Signed)
Good to see you  Ice is your friend Exercises 3 times a week.  Try to avoid high impact exercises for now.  Nitroglycerin Protocol   Apply 1/4 nitroglycerin patch to affected area daily.  Change position of patch within the affected area every 24 hours.  You may experience a headache during the first 1-2 weeks of using the patch, these should subside.  If you experience headaches after beginning nitroglycerin patch treatment, you may take your preferred over the counter pain reliever.  Another side effect of the nitroglycerin patch is skin irritation or rash related to patch adhesive.  Please notify our office if you develop more severe headaches or rash, and stop the patch.  Tendon healing with nitroglycerin patch may require 12 to 24 weeks depending on the extent of injury.  Men should not use if taking Viagra, Cialis, or Levitra.   Do not use if you have migraines or rosacea.  See me again in 4 weeks!

## 2017-01-31 ENCOUNTER — Other Ambulatory Visit: Payer: Self-pay | Admitting: Family Medicine

## 2017-01-31 NOTE — Telephone Encounter (Signed)
Refill done.  

## 2017-02-07 DIAGNOSIS — R87615 Unsatisfactory cytologic smear of cervix: Secondary | ICD-10-CM | POA: Diagnosis not present

## 2017-02-11 ENCOUNTER — Ambulatory Visit: Payer: 59

## 2017-02-18 ENCOUNTER — Encounter: Payer: Self-pay | Admitting: Family Medicine

## 2017-02-18 ENCOUNTER — Ambulatory Visit (INDEPENDENT_AMBULATORY_CARE_PROVIDER_SITE_OTHER): Payer: 59 | Admitting: Family Medicine

## 2017-02-18 VITALS — BP 120/80 | HR 69 | Ht 66.0 in | Wt 146.0 lb

## 2017-02-18 DIAGNOSIS — M24552 Contracture, left hip: Secondary | ICD-10-CM

## 2017-02-18 DIAGNOSIS — M5010 Cervical disc disorder with radiculopathy, unspecified cervical region: Secondary | ICD-10-CM | POA: Diagnosis not present

## 2017-02-18 DIAGNOSIS — Z23 Encounter for immunization: Secondary | ICD-10-CM | POA: Diagnosis not present

## 2017-02-18 DIAGNOSIS — M999 Biomechanical lesion, unspecified: Secondary | ICD-10-CM

## 2017-02-18 NOTE — Assessment & Plan Note (Signed)
Responding well to manipulation. Some tightness was noted today. Continues to do well. Patient will continue to be very active. Continue same regimen. Follow-up again in 4-6 weeks

## 2017-02-18 NOTE — Progress Notes (Signed)
[name] [address], [place] [address] Phone: [phone] Subjective:        CC: Left hip pain follow-up  [id number]  Andrea Ortiz is a 56 y.o. female coming in with complaint of left hip pain. Patient was seen on x-rays there is no significant bony abnormality. I'm concern for possible hip flexor tear that was partial on ultrasound. Patient was also having what appeared to be some mild internal derangement of the left hip concerning for possible labral pathology. Patient elected try conservative therapy including home exercises, icing regimen and activity modifications. Patient states Significant for me. States 80% better.  Patient is also seen me for back pain and neck pain. Responded very well to osteopathic manipulation. Patient states neck pain seems to be little tighter. Patient was found power for multiple days. Patient says because that she didn't is not doing her exercises quite as often.     Past Medical History:  Diagnosis Date  . 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 Alcoholism in remission ([place])    doing well  2016   . Chest pain 02/09/2011  . EXANTHEM   . PALPITATIONS, RECURRENT   . PSVT   . Sleep apnea    mouth piece   Past Surgical History:  Procedure Laterality Date  . RHINOPLASTY  1980   Social History   Social History  . Marital status: Married    Spouse name: N/A  . Number of children: N/A  . Years of education: N/A   Social History Main Topics  . Smoking status: Former Smoker    Packs/day: 1.50    Years: 15.00    Types: Cigarettes    Quit date: 08/12/1995  . Smokeless tobacco: Never Used  . Alcohol use No     Comment: quit   . Drug use: No  . Sexual activity: Not on file   Other Topics Concern  . Not on file   Social History Narrative   Divorced- Remarried- Husband has a son at [place]   One child    Dog   Works [employer]      hhof 4  No ets.    Avoids  caffiene etoh with dinner.   Exercise 5 d  Per week.    Sleep 6 hours or less    Allergies  Allergen Reactions  . Escitalopram Oxalate Swelling    hands swelled   Family History  Problem Relation Age of Onset  . Alzheimer's disease Mother   . Heart attack Mother        age 107's  . Parkinsonism Father   . Other Sister        Hypoglycemia  . Malignant hyperthermia Brother   . Colon cancer Neg Hx      Past medical history, social, surgical and family history all reviewed in electronic medical record.  No pertanent information unless stated regarding to the chief complaint.   Review of Systems:Review of systems updated and as accurate as of 02/18/17  No headache, visual changes, nausea, vomiting, diarrhea, constipation, dizziness, abdominal pain, skin rash, fevers, chills, night sweats, weight loss, swollen lymph nodes, body aches, joint swelling, chest pain, shortness of breath, mood changes. Positive muscle aches  Objective  There were no vitals taken for this visit. Systems examined below as of 02/18/17   General: No apparent distress alert and oriented x3 mood and affect normal, dressed appropriately.  HEENT: Pupils equal, extraocular movements  intact  Respiratory: Patient's speak in full sentences and does not appear short of breath  Cardiovascular: No lower extremity edema, non tender, no erythema  Skin: Warm dry intact with no signs of infection or rash on extremities or on axial skeleton.  Abdomen: Soft nontender  Neuro: Cranial nerves II through XII are intact, neurovascularly intact in all extremities with 2+ DTRs and 2+ pulses.  Lymph: No lymphadenopathy of posterior or anterior cervical chain or axillae bilaterally.  Gait normal with good balance and coordination.  MSK:  Non tender with full range of motion and good stability and symmetric strength and tone of shoulders, elbows, wrist,  knee and ankles bilaterally.  Hip: Left ROM IR: 15 Deg positive pain with  internal rotation, ER: 45 Deg, Flexion: 120 Deg, Extension: 100 Deg, Abduction: 45 Deg, Adduction: 25 Deg Strength IR: 5/5, ER: 5/5, Flexion: 5/5, Extension: 5/5, Abduction: 5/5, Adduction: 5/5 Pelvic alignment unremarkable to inspection and palpation. Standing hip rotation and gait without trendelenburg sign / unsteadiness. Greater trochanter without tenderness to palpation. No tenderness over piriformis and greater trochanter. Continued pain with internal rotation. No SI joint tenderness and normal minimal SI movement.  Neck: Inspection mild loss in lordosis. No palpable stepoffs. Negative Spurling's maneuver. Mild limitation in rotation bilaterally Grip strength and sensation normal in bilateral hands Strength good C4 to T1 distribution No sensory change to C4 to T1 Negative Hoffman sign bilaterally Reflexes normal    Osteopathic findings C2 flexed rotated and side bent right C4 flexed rotated and side bent left C7 flexed rotated and side bent left T3 extended rotated and side bent left inhaled third rib T6 extended rotated and side bent left L2 flexed rotated and side bent right Sacrum right on right      Impression and Recommendations:     This case required medical decision making of moderate complexity.      Note: This dictation was prepared with Dragon dictation along with smaller phrase technology. Any transcriptional errors that result from this process are unintentional.

## 2017-02-18 NOTE — Patient Instructions (Addendum)
Good to see you  You are doing well  Prilosec 2 times daily for 2 weeks.  Keep being active but still would avoid high impact exercises  See me again in 4-5 weeks.

## 2017-02-18 NOTE — Assessment & Plan Note (Signed)
Continues to have pain with internal rotation. Still concern for potential labral pathology but patient is improving. We'll continue to avoid certain activities. Does respond well to osteopathic manipulation. Encourage her to continue the nitroglycerin if she feels like it is beneficial. Follow-up with me again in 4-6 weeks.

## 2017-02-18 NOTE — Assessment & Plan Note (Signed)
Decision today to treat with OMT was based on Physical Exam  After verbal consent patient was treated with HVLA, ME, FPR techniques in cervical, thoracic, lumbar and sacral areas  Patient tolerated the procedure well with improvement in symptoms  Patient given exercises, stretches and lifestyle modifications  See medications in patient instructions if given  Patient will follow up in 4-6 weeks 

## 2017-03-18 ENCOUNTER — Ambulatory Visit: Payer: Self-pay | Admitting: Family Medicine

## 2017-04-10 IMAGING — DX DG CERVICAL SPINE COMPLETE 4+V
5 series · 5 of 5 positions shown · non-contrast
Comparison: None.

CLINICAL DATA: Left posterior neck and arm pain for 6 months.

EXAM:
CERVICAL SPINE - COMPLETE 4+ VIEW

[c-spine lat]
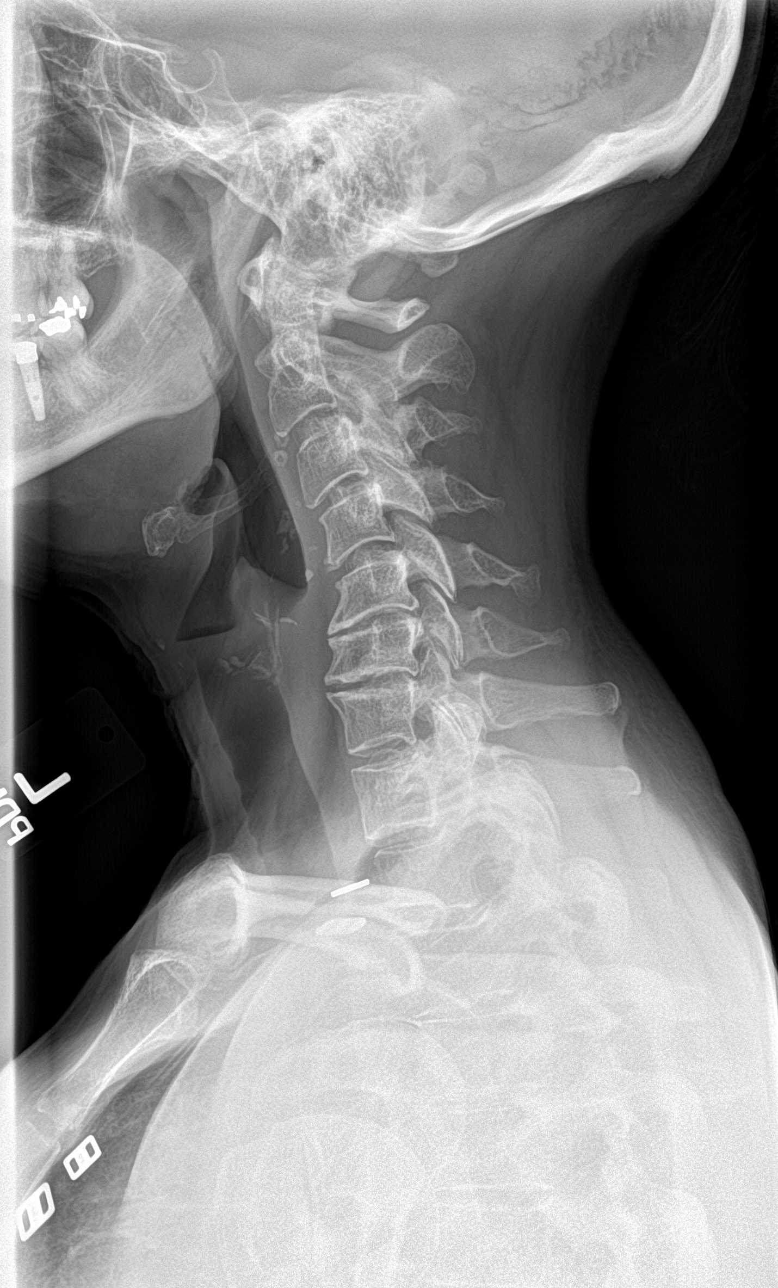

[c-spine obl (1 of 2)]
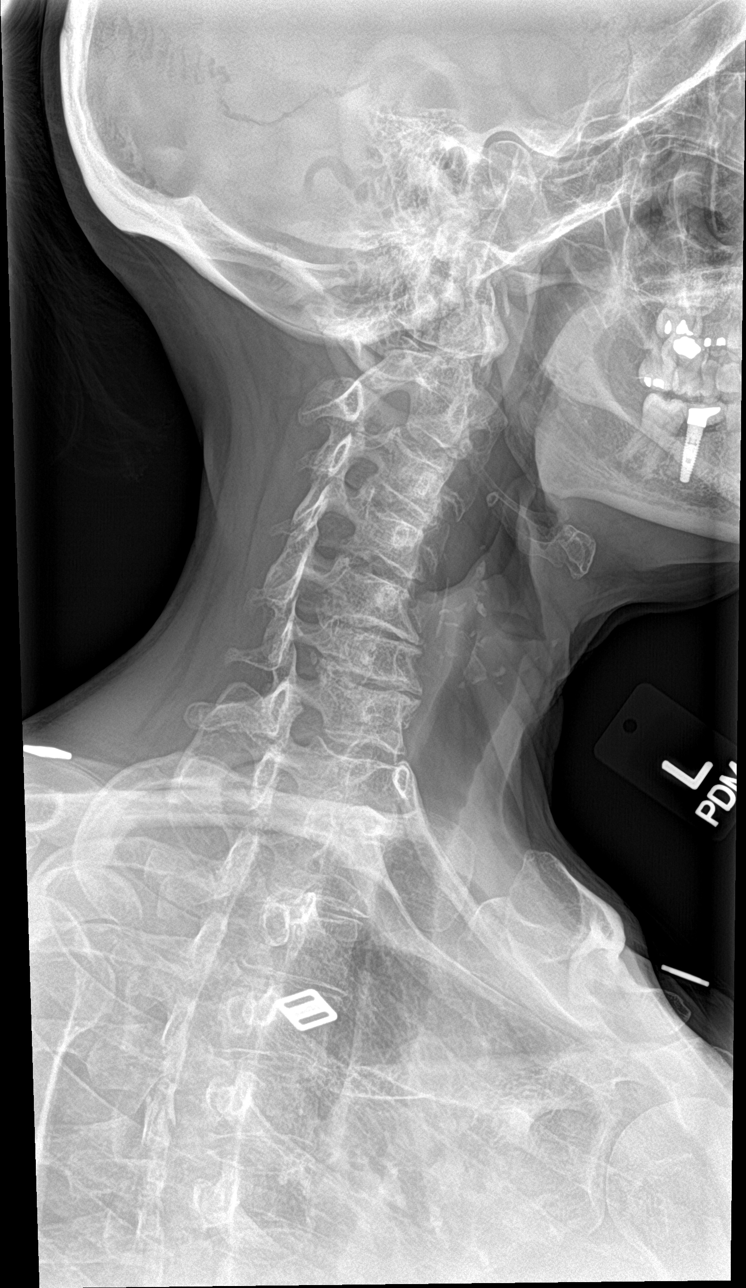

[c-spine obl (2 of 2)]
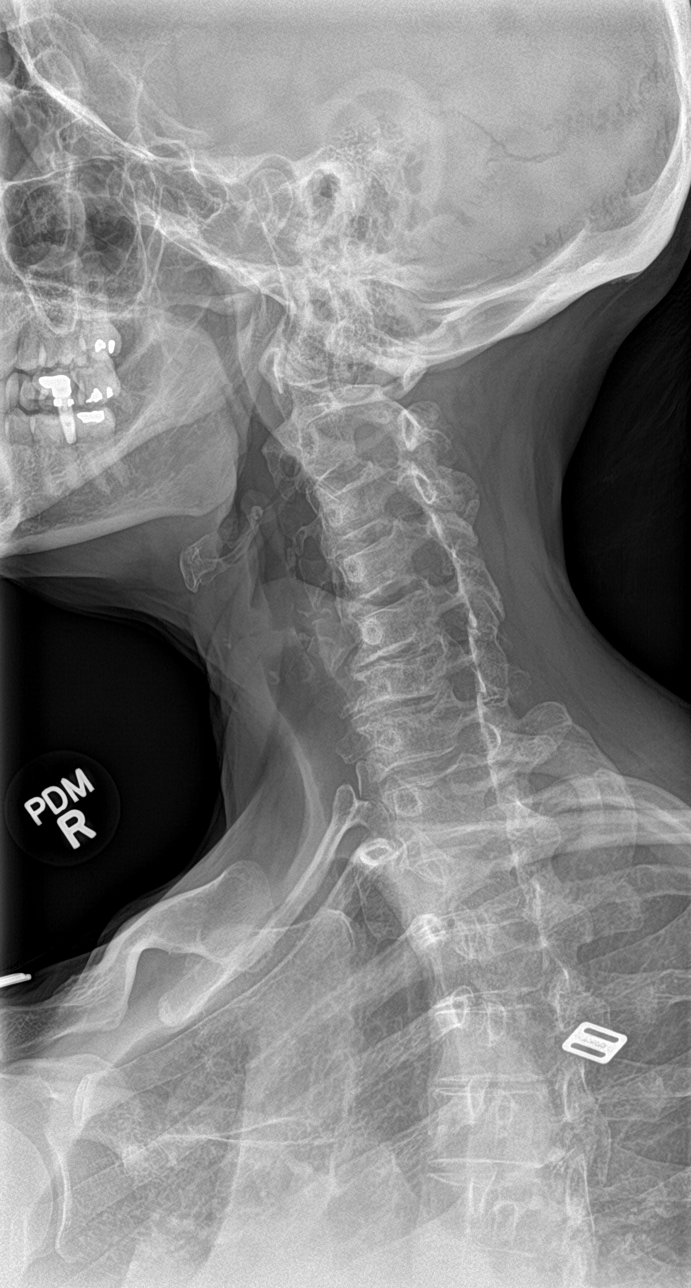

[c-spine ap]
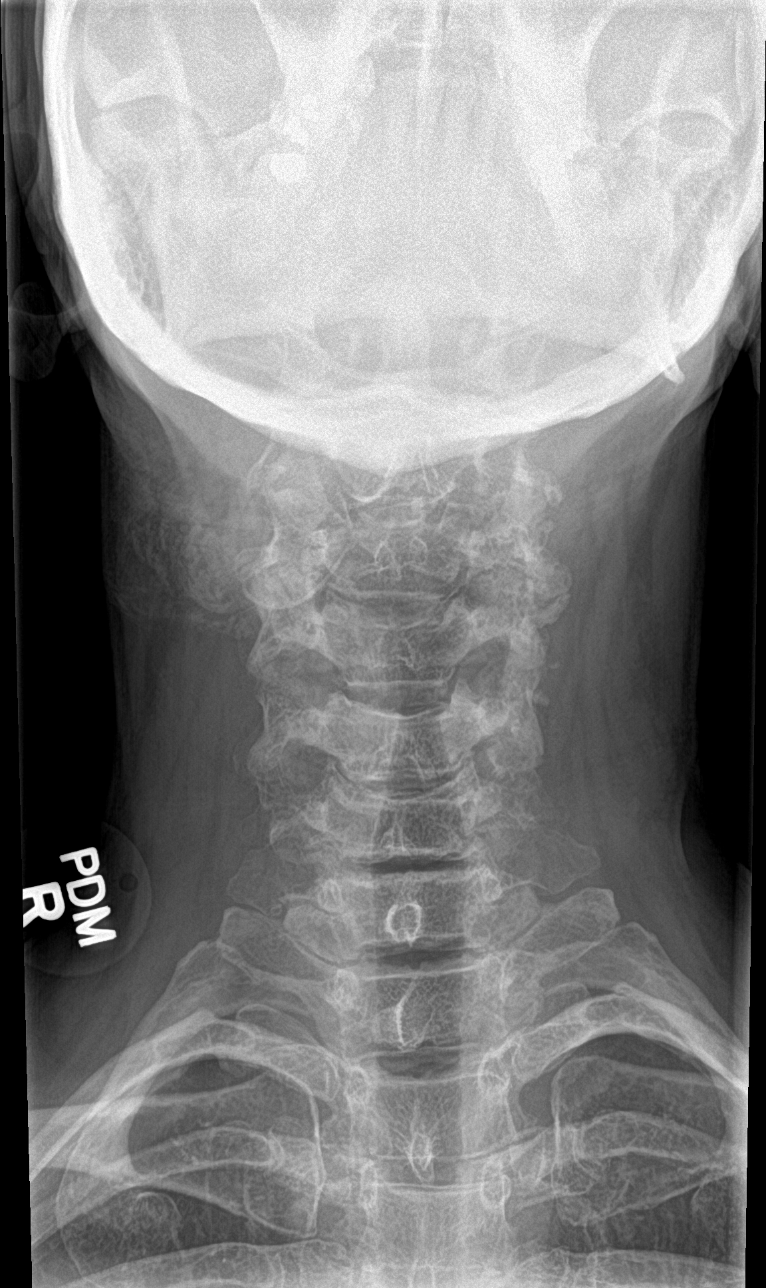

[c-spine open mouth]
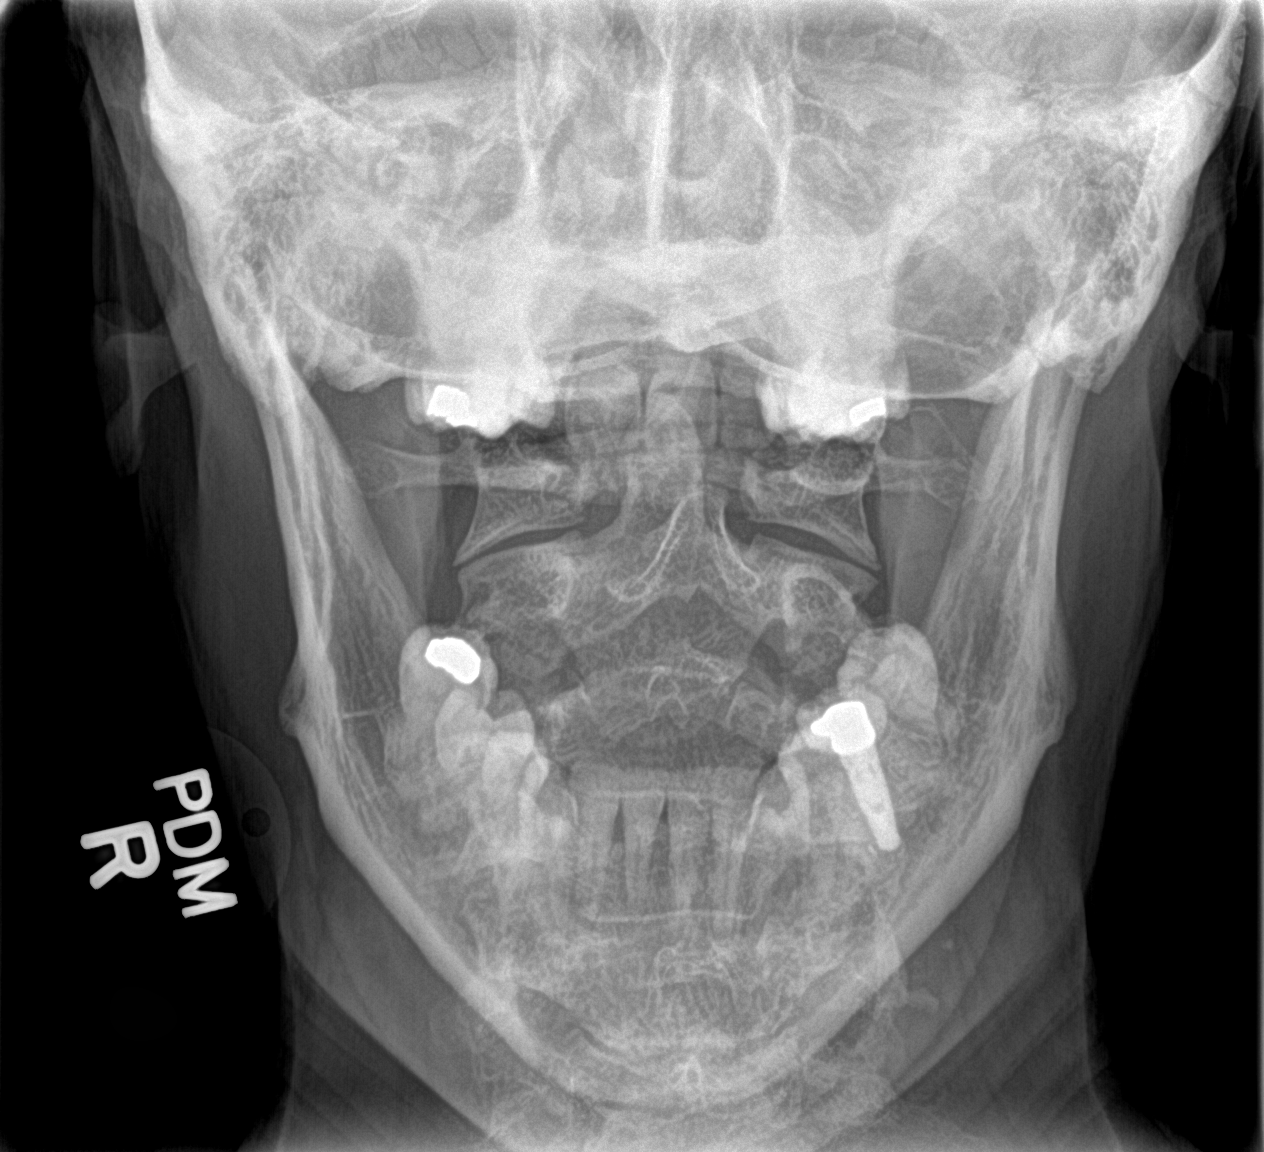

[5 of 5 positions shown; findings below may reference images not displayed]

FINDINGS: There is no evidence of cervical spine fracture or prevertebral soft
tissue swelling. There is straightening of the cervical lordosis
likely secondary to multilevel osteoarthritic changes, which are
worse at C5-C6, C6-C7 and C7-T1, where there is narrowing of the
disc space, moderate vertebral body remottling, and anterior
osteophytosis. Posterior facet arthropathy is also seen in the lower
cervical spine. The bony neural foramena are however relatively
preserved.
IMPRESSION: Multilevel osteoarthritic changes of the cervical spine, with
straightening of the cervical lordosis.

No evidence of acute fracture.

## 2017-04-13 ENCOUNTER — Ambulatory Visit: Payer: 59 | Admitting: Adult Health

## 2017-04-13 ENCOUNTER — Ambulatory Visit: Payer: Self-pay | Admitting: Adult Health

## 2017-04-13 ENCOUNTER — Encounter: Payer: Self-pay | Admitting: Adult Health

## 2017-04-13 VITALS — BP 118/70 | Temp 98.0°F | Wt 150.0 lb

## 2017-04-13 DIAGNOSIS — J40 Bronchitis, not specified as acute or chronic: Secondary | ICD-10-CM | POA: Diagnosis not present

## 2017-04-13 MED ORDER — PREDNISONE 10 MG PO TABS: ORAL_TABLET | ORAL | 0 refills | Status: DC

## 2017-04-13 MED ORDER — BENZONATATE 200 MG PO CAPS: 200.0000 mg | ORAL_CAPSULE | Freq: Two times a day (BID) | ORAL | 1 refills | Status: DC | PRN

## 2017-04-13 NOTE — Patient Instructions (Addendum)
It was great seeing you today   Your exam is consistent with bronchitis.   I have sent in prednisone and tessalon pearls.   Please let us know if you are not feeling any better in the next 2-3 days    Acute Bronchitis, Adult Acute bronchitis is when air tubes (bronchi) in the lungs suddenly get swollen. The condition can make it hard to breathe. It can also cause these symptoms:  A cough.  Coughing up clear, yellow, or green mucus.  Wheezing.  Chest congestion.  Shortness of breath.  A fever.  Body aches.  Chills.  A sore throat.  Follow these instructions at home: Medicines  Take over-the-counter and prescription medicines only as told by your doctor.  If you were prescribed an antibiotic medicine, take it as told by your doctor. Do not stop taking the antibiotic even if you start to feel better. General instructions  Rest.  Drink enough fluids to keep your pee (urine) clear or pale yellow.  Avoid smoking and secondhand smoke. If you smoke and you need help quitting, ask your doctor. Quitting will help your lungs heal faster.  Use an inhaler, cool mist vaporizer, or humidifier as told by your doctor.  Keep all follow-up visits as told by your doctor. This is important. How is this prevented? To lower your risk of getting this condition again:  Wash your hands often with soap and water. If you cannot use soap and water, use hand sanitizer.  Avoid contact with people who have cold symptoms.  Try not to touch your hands to your mouth, nose, or eyes.  Make sure to get the flu shot every year.  Contact a doctor if:  Your symptoms do not get better in 2 weeks. Get help right away if:  You cough up blood.  You have chest pain.  You have very bad shortness of breath.  You become dehydrated.  You faint (pass out) or keep feeling like you are going to pass out.  You keep throwing up (vomiting).  You have a very bad headache.  Your fever or chills  gets worse. This information is not intended to replace advice given to you by your health care provider. Make sure you discuss any questions you have with your health care provider. Document Released: 10/06/2007 Document Revised: 11/26/2015 Document Reviewed: 10/08/2015 Elsevier Interactive Patient Education  2017 [employer].

## 2017-04-13 NOTE — Progress Notes (Signed)
Subjective:    Patient ID: Andrea Ortiz, female    DOB: 28-Oct-1960, 56 y.o.   MRN: [id number]  URI   This is a new problem. The current episode started in the past 7 days. Maximum temperature: subjective low grade fever that has resolved  The fever has been present for less than 1 day. Associated symptoms include coughing (semi productive ), headaches, a plugged ear sensation, rhinorrhea (clear) and wheezing. Pertinent negatives include no abdominal pain, diarrhea, ear pain, nausea, sinus pain or sore throat. She has tried decongestant and NSAIDs (mucinex and advil ) for the symptoms. The treatment provided mild relief.    Review of Systems  Constitutional: Negative.   HENT: Positive for rhinorrhea (clear). Negative for ear discharge, ear pain, sinus pressure, sinus pain, sore throat and trouble swallowing.   Respiratory: Positive for cough (semi productive ), chest tightness and wheezing. Negative for shortness of breath.   Cardiovascular: Negative.   Gastrointestinal: Negative for abdominal pain, diarrhea and nausea.  Neurological: Positive for headaches.   Past Medical History:  Diagnosis Date  . 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 Alcoholism in remission ([place])    doing well  2016   . Chest pain 02/09/2011  . EXANTHEM   . PALPITATIONS, RECURRENT   . PSVT   . Sleep apnea    mouth piece    Social History   Socioeconomic History  . Marital status: Married    Spouse name: Not on file  . Number of children: Not on file  . Years of education: Not on file  . Highest education level: Not on file  Social Needs  . Financial resource strain: Not on file  . Food insecurity - worry: Not on file  . Food insecurity - inability: Not on file  . Transportation needs - medical: Not on file  . Transportation needs - non-medical: Not on file  Occupational History  . Not on file  Tobacco Use  . Smoking status: Former Smoker    Packs/day: 1.50    Years: 15.00   Pack years: 22.50    Types: Cigarettes    Last attempt to quit: 08/12/1995    Years since quitting: 21.6  . Smokeless tobacco: Never Used  Substance and Sexual Activity  . Alcohol use: No    Alcohol/week: 3.0 oz    Types: 5 Glasses of wine per week    Comment: quit   . Drug use: No  . Sexual activity: Not on file  Other Topics Concern  . Not on file  Social History Narrative   Divorced- Remarried- Husband has a son at [place]   One child    Dog   Works [employer]      hhof 4  No ets.    Avoids caffiene etoh with dinner.   Exercise 5 d  Per week.    Sleep 6 hours or less     Past Surgical History:  Procedure Laterality Date  . RHINOPLASTY  1980    Family History  Problem Relation Age of Onset  . Alzheimer's disease Mother   . Heart attack Mother        age 68's  . Parkinsonism Father   . Other Sister        Hypoglycemia  . Malignant hyperthermia Brother   . Colon cancer Neg Hx     Allergies  Allergen Reactions  . Escitalopram Oxalate Swelling    hands swelled    Current  Outpatient Medications on File Prior to Visit  Medication Sig Dispense Refill  . desoximetasone (TOPICORT) 0.05 % cream Apply topically 2 (two) times daily. For 2 weeks at a time     . Diclofenac Sodium 2 % SOLN Apply 1 pump twice daily. 112 g 3  . DULoxetine (CYMBALTA) 30 MG capsule Take 90 mg by mouth daily.     Marland Kitchen gabapentin (NEURONTIN) 300 MG capsule TAKE 1 CAPSULE BY MOUTH 3  TIMES DAILY 270 capsule 0  . meloxicam (MOBIC) 15 MG tablet TAKE 1 TABLET BY MOUTH  DAILY 90 tablet 0  . metoprolol (LOPRESSOR) 50 MG tablet TAKE 1/2 TABLET BY MOUTH TWICE DAILY OR AS DIRECTED 90 tablet 0  . metoprolol tartrate (LOPRESSOR) 50 MG tablet TAKE ONE-HALF TABLET BY  MOUTH TWICE DAILY OR AS  DIRECTED 90 tablet 0  . nitroGLYCERIN (NITRODUR - DOSED IN MG/24 HR) 0.2 mg/hr patch UNWRAP AND APPLY 1/4 PATCH TO SKIN EVERY DAY 90 patch 0  . Vitamin D, Ergocalciferol, (DRISDOL) 50000 units CAPS capsule TAKE 1  CAPSULE BY MOUTH  EVERY 7 DAYS 12 capsule 0   Current Facility-Administered Medications on File Prior to Visit  Medication Dose Route Frequency Provider Last Rate Last Dose  . 0.9 %  sodium chloride infusion  500 mL Intravenous Continuous Armbruster, Carlota Raspberry, MD        BP 118/70   Temp 98 F (36.7 C) (Oral)   Wt 150 lb (68 kg)   BMI 24.21 kg/m       Objective:   Physical Exam  Constitutional: She is oriented to person, place, and time. She appears well-developed and well-nourished. No distress.  HENT:  Head: Normocephalic and atraumatic.  Right Ear: External ear normal.  Left Ear: External ear normal.  Nose: Nose normal.  Mouth/Throat: Oropharynx is clear and moist. No oropharyngeal exudate.  Eyes: Conjunctivae and EOM are normal. Pupils are equal, round, and reactive to light. Right eye exhibits no discharge.  Neck: Normal range of motion. Neck supple. No thyromegaly present.  Cardiovascular: Normal rate, regular rhythm and intact distal pulses. Exam reveals no gallop and no friction rub.  No murmur heard. Pulmonary/Chest: Effort normal. No respiratory distress. She has wheezes (expiratory wheezes throughout ). She has no rales. She exhibits no tenderness.  Lymphadenopathy:    She has no cervical adenopathy.  Neurological: She is alert and oriented to person, place, and time.  Skin: Skin is warm and dry. No rash noted. She is not diaphoretic. No erythema. No pallor.  Psychiatric: She has a normal mood and affect. Her behavior is normal. Thought content normal.  Nursing note and vitals reviewed.     Assessment & Plan:  1. Bronchitis - predniSONE (DELTASONE) 10 MG tablet; 40 mg x 3 days, 20 mg x 3 days, 10 mg x 3 days  Dispense: 21 tablet; Refill: 0 - benzonatate (TESSALON) 200 MG capsule; Take 1 capsule (200 mg total) by mouth 2 (two) times daily as needed for cough.  Dispense: 20 capsule; Refill: 1 - No signs of bacterial infection. Advised rest and hydration  - Follow up  in 2-3 days if no improvement or sooner if fever develops   Dorothyann Peng, NP

## 2017-04-14 ENCOUNTER — Ambulatory Visit: Payer: Self-pay | Admitting: Adult Health

## 2017-04-15 ENCOUNTER — Other Ambulatory Visit: Payer: Self-pay | Admitting: Family Medicine

## 2017-04-15 ENCOUNTER — Other Ambulatory Visit: Payer: Self-pay | Admitting: Internal Medicine

## 2017-06-27 ENCOUNTER — Other Ambulatory Visit: Payer: Self-pay | Admitting: Family Medicine

## 2017-09-07 ENCOUNTER — Other Ambulatory Visit: Payer: Self-pay | Admitting: Family Medicine

## 2017-09-08 NOTE — Telephone Encounter (Signed)
Refill done.  

## 2017-09-18 ENCOUNTER — Other Ambulatory Visit: Payer: Self-pay | Admitting: Internal Medicine

## 2017-11-18 ENCOUNTER — Other Ambulatory Visit: Payer: Self-pay | Admitting: Family Medicine

## 2017-11-21 NOTE — Telephone Encounter (Signed)
Refill done.  

## 2017-11-29 ENCOUNTER — Other Ambulatory Visit: Payer: Self-pay | Admitting: Internal Medicine

## 2017-12-12 DIAGNOSIS — L821 Other seborrheic keratosis: Secondary | ICD-10-CM | POA: Diagnosis not present

## 2017-12-12 DIAGNOSIS — D225 Melanocytic nevi of trunk: Secondary | ICD-10-CM | POA: Diagnosis not present

## 2017-12-12 DIAGNOSIS — L570 Actinic keratosis: Secondary | ICD-10-CM | POA: Diagnosis not present

## 2017-12-12 DIAGNOSIS — L814 Other melanin hyperpigmentation: Secondary | ICD-10-CM | POA: Diagnosis not present

## 2017-12-14 NOTE — Progress Notes (Signed)
Chief Complaint  Patient presents with  . Fatigue    Pt states that she is "run down" and exhausted. Pt is sleeping 8-10 hours at night and still feels exhausted. Pt states that at times she is disoriented d/t the fatigue. Pt does have mild sleep apnea and has a mouth guard but has not been wearing this consistently.     HPI: Andrea Ortiz 57 y.o. come in for concern about exhaustion  Months of  Sleepiness all the time and unreflreshed by sleep in am  Only change is  Dec cymbalta to 60 mg as mood is good  Not using for a while was ok.     Had inhome sleep study .     neg tad   caffiene .   Taking metoprolol   50 in am instead of  25 bid .  ROS: See pertinent positives and negatives per HPI. Neg gi gu cv syncope exercises well   No bleeding  Is menopausal  Past Medical History:  Diagnosis Date  . 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 Alcoholism in remission ([place])    doing well  2016   . Chest pain 02/09/2011  . EXANTHEM   . PALPITATIONS, RECURRENT   . PSVT   . Sleep apnea    mouth piece    Family History  Problem Relation Age of Onset  . Alzheimer's disease Mother   . Heart attack Mother        age 36's  . Parkinsonism Father   . Other Sister        Hypoglycemia  . Malignant hyperthermia Brother   . Colon cancer Neg Hx     Social History   Socioeconomic History  . Marital status: Married    Spouse name: Not on file  . Number of children: Not on file  . Years of education: Not on file  . Highest education level: Not on file  Occupational History  . Not on file  Social Needs  . Financial resource strain: Not on file  . Food insecurity:    Worry: Not on file    Inability: Not on file  . Transportation needs:    Medical: Not on file    Non-medical: Not on file  Tobacco Use  . Smoking status: Former Smoker    Packs/day: 1.50    Years: 15.00    Pack years: 22.50    Types: Cigarettes    Last attempt to quit: 08/12/1995    Years since  quitting: 22.3  . Smokeless tobacco: Never Used  Substance and Sexual Activity  . Alcohol use: No    Alcohol/week: 5.0 standard drinks    Types: 5 Glasses of wine per week    Comment: quit   . Drug use: No  . Sexual activity: Not on file  Lifestyle  . Physical activity:    Days per week: Not on file    Minutes per session: Not on file  . Stress: Not on file  Relationships  . Social connections:    Talks on phone: Not on file    Gets together: Not on file    Attends religious service: Not on file    Active member of club or organization: Not on file    Attends meetings of clubs or organizations: Not on file    Relationship status: Not on file  Other Topics Concern  . Not on file  Social History Narrative   Divorced- Remarried-  Husband has a son at [place]   One child    Dog   Works [employer]      hhof 4  No ets.    Avoids caffiene etoh with dinner.   Exercise 5 d  Per week.    Sleep 6 hours or less     Outpatient Medications Prior to Visit  Medication Sig Dispense Refill  . desoximetasone (TOPICORT) 0.05 % cream Apply topically 2 (two) times daily. For 2 weeks at a time     . Diclofenac Sodium 2 % SOLN Apply 1 pump twice daily. 112 g 3  . DULoxetine (CYMBALTA) 30 MG capsule Take 90 mg by mouth daily.     Marland Kitchen gabapentin (NEURONTIN) 300 MG capsule TAKE 1 CAPSULE BY MOUTH 3  TIMES DAILY 270 capsule 0  . meloxicam (MOBIC) 15 MG tablet TAKE 1 TABLET BY MOUTH  DAILY 90 tablet 0  . metoprolol (LOPRESSOR) 50 MG tablet TAKE 1/2 TABLET BY MOUTH TWICE DAILY OR AS DIRECTED 90 tablet 0  . Vitamin D, Ergocalciferol, (DRISDOL) 50000 units CAPS capsule TAKE 1 CAPSULE BY MOUTH  EVERY 7 DAYS 12 capsule 0  . benzonatate (TESSALON) 200 MG capsule Take 1 capsule (200 mg total) by mouth 2 (two) times daily as needed for cough. (Patient not taking: Reported on 12/15/2017) 20 capsule 1  . metoprolol tartrate (LOPRESSOR) 50 MG tablet TAKE ONE-HALF TABLET BY  MOUTH TWICE DAILY OR AS  DIRECTED  (Patient not taking: Reported on 12/15/2017) 30 tablet 0  . nitroGLYCERIN (NITRODUR - DOSED IN MG/24 HR) 0.2 mg/hr patch UNWRAP AND APPLY 1/4 PATCH TO SKIN EVERY DAY (Patient not taking: Reported on 12/15/2017) 90 patch 0  . predniSONE (DELTASONE) 10 MG tablet 40 mg x 3 days, 20 mg x 3 days, 10 mg x 3 days (Patient not taking: Reported on 12/15/2017) 21 tablet 0   Facility-Administered Medications Prior to Visit  Medication Dose Route Frequency Provider Last Rate Last Dose  . 0.9 %  sodium chloride infusion  500 mL Intravenous Continuous Armbruster, Carlota Raspberry, MD         EXAM:  BP 102/62 (BP Location: Right Arm, Patient Position: Sitting, Cuff Size: Normal)   Pulse 60   Temp 98.1 F (36.7 C) (Oral)   Wt 141 lb (64 kg)   BMI 22.76 kg/m   Body mass index is 22.76 kg/m.  GENERAL: vitals reviewed and listed above, alert, oriented, appears well hydrated and in no acute distress HEENT: atraumatic, conjunctiva  clear, no obvious abnormalities on inspection of external nose and earstm nl OP : no lesion edema or exudate  NECK: no obvious masses on inspection palpation  LUNGS: clear to auscultation bilaterally, no wheezes, rales or rhonchi, good air movement CV: HRRR, no clubbing cyanosis or  peripheral edema nl cap refill  Abdomen:  Sof,t normal bowel sounds without hepatosplenomegaly, no guarding rebound or masses no CVA tenderness Neuro no nocal  No adenopathy  MS: moves all extremities without noticeable focal  abnormality PSYCH: pleasant and cooperative, no obvious depression or anxiety  BP Readings from Last 3 Encounters:  12/15/17 102/62  04/13/17 118/70  02/18/17 120/80   Wt Readings from Last 3 Encounters:  12/15/17 141 lb (64 kg)  04/13/17 150 lb (68 kg)  02/18/17 146 lb (66.2 kg)    ekg sinus brady 57  ASSESSMENT AND PLAN:  Discussed the following assessment and plan:  Exhaustion - Plan: Basic metabolic panel, CBC with Differential/Platelet, Hemoglobin A1c, Hepatic  function  panel, TSH, T4, free, T3, free, Lipid panel, EKG 12-Lead  Medication management - Plan: Basic metabolic panel, CBC with Differential/Platelet, Hemoglobin A1c, Hepatic function panel, TSH, T4, free, T3, free, Lipid panel, EKG 12-Lead  OSA (obstructive sleep apnea)  Bradycardia - Plan: Basic metabolic panel, CBC with Differential/Platelet, Hemoglobin A1c, Hepatic function panel, TSH, T4, free, T3, free, Lipid panel, EKG 12-Lead prob in adequate sleep   R/o metabolic     Add oral appliance take the metoprolol 25 bid consider dec dose if  Loletha Grayer a( although exercising wo problem and no syncope etc)  revewie of record shows very ghihg lipid  Checking today  (NF)  -Patient advised to return or notify health care team  if  new concerns arise.  Patient Instructions  Probably  Effective sleep is the cause of  The sleepiness .  Your exam is reassuring and checking labs for metabolic problems or anemia . EKG is nl except slightly slow heart rate. But  If you can exercise probably fine for you.  Check your pulse at home and if 55 and below   consdier decreasing the dose of metoprolol .   Gabapentin can cause drowseiness.   Use your mouth appliance for the next month or so and track sleep  If not better then recheck and go from there.      Standley Brooking. Zimal Weisensel M.D.

## 2017-12-15 ENCOUNTER — Encounter: Payer: Self-pay | Admitting: Internal Medicine

## 2017-12-15 ENCOUNTER — Ambulatory Visit: Payer: 59 | Admitting: Internal Medicine

## 2017-12-15 VITALS — BP 102/62 | HR 60 | Temp 98.1°F | Wt 141.0 lb

## 2017-12-15 DIAGNOSIS — Z79899 Other long term (current) drug therapy: Secondary | ICD-10-CM | POA: Diagnosis not present

## 2017-12-15 DIAGNOSIS — G4733 Obstructive sleep apnea (adult) (pediatric): Secondary | ICD-10-CM

## 2017-12-15 DIAGNOSIS — R001 Bradycardia, unspecified: Secondary | ICD-10-CM | POA: Diagnosis not present

## 2017-12-15 DIAGNOSIS — R5383 Other fatigue: Secondary | ICD-10-CM

## 2017-12-15 LAB — CBC WITH DIFFERENTIAL/PLATELET
BASOS ABS: 0 10*3/uL (ref 0.0–0.1)
Basophils Relative: 1.1 % (ref 0.0–3.0)
Eosinophils Absolute: 0.1 10*3/uL (ref 0.0–0.7)
Eosinophils Relative: 2.7 % (ref 0.0–5.0)
HEMATOCRIT: 35.8 % — AB (ref 36.0–46.0)
Hemoglobin: 12.3 g/dL (ref 12.0–15.0)
Lymphocytes Relative: 45 % (ref 12.0–46.0)
Lymphs Abs: 1.9 10*3/uL (ref 0.7–4.0)
MCHC: 34.3 g/dL (ref 30.0–36.0)
MCV: 92.1 fl (ref 78.0–100.0)
MONOS PCT: 13.4 % — AB (ref 3.0–12.0)
Monocytes Absolute: 0.6 10*3/uL (ref 0.1–1.0)
NEUTROS PCT: 37.8 % — AB (ref 43.0–77.0)
Neutro Abs: 1.6 10*3/uL (ref 1.4–7.7)
Platelets: 358 10*3/uL (ref 150.0–400.0)
RBC: 3.89 Mil/uL (ref 3.87–5.11)
RDW: 12.6 % (ref 11.5–15.5)
WBC: 4.2 10*3/uL (ref 4.0–10.5)

## 2017-12-15 LAB — BASIC METABOLIC PANEL
BUN: 13 mg/dL (ref 6–23)
CALCIUM: 10 mg/dL (ref 8.4–10.5)
CO2: 31 mEq/L (ref 19–32)
Chloride: 101 mEq/L (ref 96–112)
Creatinine, Ser: 0.87 mg/dL (ref 0.40–1.20)
GFR: 71.26 mL/min (ref >60.00)
Glucose, Bld: 77 mg/dL (ref 70–99)
Potassium: 4.6 mEq/L (ref 3.5–5.1)
SODIUM: 137 meq/L (ref 135–145)

## 2017-12-15 LAB — HEPATIC FUNCTION PANEL
ALK PHOS: 38 U/L — AB (ref 39–117)
ALT: 12 U/L (ref 0–35)
AST: 20 U/L (ref 0–37)
Albumin: 4.7 g/dL (ref 3.5–5.2)
BILIRUBIN DIRECT: 0.1 mg/dL (ref 0.0–0.3)
TOTAL PROTEIN: 7 g/dL (ref 6.0–8.3)
Total Bilirubin: 0.5 mg/dL (ref 0.2–1.2)

## 2017-12-15 LAB — LIPID PANEL
Cholesterol: 337 mg/dL — ABNORMAL HIGH (ref 0–200)
HDL: 95.8 mg/dL (ref >39.00)
LDL Cholesterol: 217 mg/dL — ABNORMAL HIGH (ref 0–99)
NONHDL: 240.73
Total CHOL/HDL Ratio: 4
Triglycerides: 117 mg/dL (ref 0.0–149.0)
VLDL: 23.4 mg/dL (ref 0.0–40.0)

## 2017-12-15 LAB — HEMOGLOBIN A1C: Hgb A1c MFr Bld: 6.1 % (ref 4.6–6.5)

## 2017-12-15 LAB — T3, FREE: T3 FREE: 3 pg/mL (ref 2.3–4.2)

## 2017-12-15 LAB — T4, FREE: Free T4: 0.72 ng/dL (ref 0.60–1.60)

## 2017-12-15 LAB — TSH: TSH: 1.53 u[IU]/mL (ref 0.35–4.50)

## 2017-12-15 NOTE — Patient Instructions (Addendum)
Probably  Effective sleep is the cause of  The sleepiness .  Your exam is reassuring and checking labs for metabolic problems or anemia . EKG is nl except slightly slow heart rate. But  If you can exercise probably fine for you.  Check your pulse at home and if 55 and below   consdier decreasing the dose of metoprolol .   Gabapentin can cause drowseiness.   Use your mouth appliance for the next month or so and track sleep  If not better then recheck and go from there.

## 2017-12-19 NOTE — Progress Notes (Signed)
[facility] [name], Andrea Ortiz [address] Phone: [phone] Subjective:    I'm seeing this patient by the request  of:    CC: Back and left hip pain  HUD:[id number]  Andrea Ortiz is a 57 y.o. female coming in with complaint of back and hip pain. Needs an adjustment. Also has knee pain bilaterally. Sometimes TTP. No numbness and tingling noted. Physically active. Modifies exercises.   Onset- 6 months  Location- medial Character- Sharp Aggravating factors- Stairs, seated to standing Severity-7 out of 10 and worsening Has been seen before for the back and has responded to manipulation previously.  Feels like that would be beneficial again.  Patient also states that the pain seems to be worsening and there was a concern for some labral pathology previously.  X-rays of the hip that were taken previously were unremarkable.    Past Medical History:  Diagnosis Date  . 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 Alcoholism in remission ([place])    doing well  2016   . Chest pain 02/09/2011  . EXANTHEM   . PALPITATIONS, RECURRENT   . PSVT   . Sleep apnea    mouth piece   Past Surgical History:  Procedure Laterality Date  . RHINOPLASTY  1980   Social History   Socioeconomic History  . Marital status: Married    Spouse name: Not on file  . Number of children: Not on file  . Years of education: Not on file  . Highest education level: Not on file  Occupational History  . Not on file  Social Needs  . Financial resource strain: Not on file  . Food insecurity:    Worry: Not on file    Inability: Not on file  . Transportation needs:    Medical: Not on file    Non-medical: Not on file  Tobacco Use  . Smoking status: Former Smoker    Packs/day: 1.50    Years: 15.00    Pack years: 22.50    Types: Cigarettes    Last attempt to quit: 08/12/1995    Years since quitting: 22.3  . Smokeless tobacco: Never Used  Substance and  Sexual Activity  . Alcohol use: No    Alcohol/week: 5.0 standard drinks    Types: 5 Glasses of wine per week    Comment: quit   . Drug use: No  . Sexual activity: Not on file  Lifestyle  . Physical activity:    Days per week: Not on file    Minutes per session: Not on file  . Stress: Not on file  Relationships  . Social connections:    Talks on phone: Not on file    Gets together: Not on file    Attends religious service: Not on file    Active member of club or organization: Not on file    Attends meetings of clubs or organizations: Not on file    Relationship status: Not on file  Other Topics Concern  . Not on file  Social History Narrative   Divorced- Remarried- Husband has a son at [place]   One child    Dog   Works [employer]      hhof 4  No ets.    Avoids caffiene etoh with dinner.   Exercise 5 d  Per week.    Sleep 6 hours or less    Allergies  Allergen Reactions  . Escitalopram Oxalate Swelling  hands swelled   Family History  Problem Relation Age of Onset  . Alzheimer's disease Mother   . Heart attack Mother        age 66's  . Parkinsonism Father   . Other Sister        Hypoglycemia  . Malignant hyperthermia Brother   . Colon cancer Neg Hx      Past medical history, social, surgical and family history all reviewed in electronic medical record.  No pertanent information unless stated regarding to the chief complaint.   Review of Systems:Review of systems updated and as accurate as of 12/21/17  No headache, visual changes, nausea, vomiting, diarrhea, constipation, dizziness, abdominal pain, skin rash, fevers, chills, night sweats, weight loss, swollen lymph nodes, body aches, joint swelling, chest pain, shortness of breath, mood changes.  Positive muscle aches  Objective  Blood pressure 100/80, pulse 68, height 5\' 6"  (1.676 m), weight 135 lb (61.2 kg), SpO2 98 %. Systems examined below as of 12/21/17   General: No apparent distress alert and  oriented x3 mood and affect normal, dressed appropriately.  HEENT: Pupils equal, extraocular movements intact  Respiratory: Patient's speak in full sentences and does not appear short of breath  Cardiovascular: No lower extremity edema, non tender, no erythema  Skin: Warm dry intact with no signs of infection or rash on extremities or on axial skeleton.  Abdomen: Soft nontender  Neuro: Cranial nerves II through XII are intact, neurovascularly intact in all extremities with 2+ DTRs and 2+ pulses.  Lymph: No lymphadenopathy of posterior or anterior cervical chain or axillae bilaterally.  Gait normal with good balance and coordination.  MSK:  Non tender with full range of motion and good stability and symmetric strength and tone of shoulders, elbows, wrist,  and ankles bilaterally.  Left hip exam shows the patient does have some mild increase in range of motion with increasing pain with internal rotation and positive grind test.  Bilateral knee show some patella alta as well as some lateral tracking of the kneecaps.  Positive patella grind bilaterally  Back exam shows loss of lordosis.  Patient does have some tightness of the [facility] test bilaterally.  Patient has negative straight leg test bilaterally at the moment.  Neurovascular intact distally with 5 out of 5 strength near full range of motion of the back mild increasing discomfort with flexion and extension  Osteopathic findings C2 flexed rotated and side bent right C6 flexed rotated and side bent left T3 extended rotated and side bent right inhaled third rib T7 extended rotated and side bent left L2 flexed rotated and side bent right Sacrum left on left  Procedure: Real-time Ultrasound Guided Injection of left intra-articular hip Device: GE Logiq Q7  Ultrasound guided injection is preferred based studies that show increased duration, increased effect, greater accuracy, decreased procedural pain, increased response rate with ultrasound  guided versus blind injection.  Verbal informed consent obtained.  Time-out conducted.  Noted no overlying erythema, induration, or other signs of local infection.  Skin prepped in a sterile fashion.  Local anesthesia: Topical Ethyl chloride.  With sterile technique and under real time ultrasound guidance:  Anterior capsule visualized, needle visualized going to the head neck junction at the anterior capsule. Pictures taken. Patient did have injection of 3 cc of 0.5% Marcaine, and 1 cc of Kenalog 40 mg/dL. Completed without difficulty  Pain immediately resolved suggesting accurate placement of the medication.  Advised to call if fevers/chills, erythema, induration, drainage, or persistent bleeding.  Images permanently stored and available for review in the ultrasound unit.  Impression: Technically successful ultrasound guided injection.    Impression and Recommendations:     This case required medical decision making of moderate complexity.      Note: This dictation was prepared with Dragon dictation along with smaller phrase technology. Any transcriptional errors that result from this process are unintentional.

## 2017-12-21 ENCOUNTER — Ambulatory Visit: Payer: Self-pay

## 2017-12-21 ENCOUNTER — Ambulatory Visit (INDEPENDENT_AMBULATORY_CARE_PROVIDER_SITE_OTHER)
Admission: RE | Admit: 2017-12-21 | Discharge: 2017-12-21 | Disposition: A | Discharge status: Home / Self Care | Payer: 59 | Source: Ambulatory Visit | Attending: Family Medicine | Admitting: Family Medicine

## 2017-12-21 ENCOUNTER — Ambulatory Visit: Payer: 59 | Admitting: Family Medicine

## 2017-12-21 ENCOUNTER — Encounter: Payer: Self-pay | Admitting: Family Medicine

## 2017-12-21 VITALS — BP 100/80 | HR 68 | Ht 66.0 in | Wt 135.0 lb

## 2017-12-21 DIAGNOSIS — M9904 Segmental and somatic dysfunction of sacral region: Secondary | ICD-10-CM

## 2017-12-21 DIAGNOSIS — M9901 Segmental and somatic dysfunction of cervical region: Secondary | ICD-10-CM | POA: Diagnosis not present

## 2017-12-21 DIAGNOSIS — M25552 Pain in left hip: Secondary | ICD-10-CM | POA: Diagnosis not present

## 2017-12-21 DIAGNOSIS — G8929 Other chronic pain: Secondary | ICD-10-CM

## 2017-12-21 DIAGNOSIS — M9902 Segmental and somatic dysfunction of thoracic region: Secondary | ICD-10-CM | POA: Diagnosis not present

## 2017-12-21 DIAGNOSIS — M222X2 Patellofemoral disorders, left knee: Secondary | ICD-10-CM

## 2017-12-21 DIAGNOSIS — M25561 Pain in right knee: Secondary | ICD-10-CM

## 2017-12-21 DIAGNOSIS — M222X1 Patellofemoral disorders, right knee: Secondary | ICD-10-CM

## 2017-12-21 DIAGNOSIS — M999 Biomechanical lesion, unspecified: Secondary | ICD-10-CM

## 2017-12-21 DIAGNOSIS — M25562 Pain in left knee: Secondary | ICD-10-CM | POA: Diagnosis not present

## 2017-12-21 DIAGNOSIS — M9905 Segmental and somatic dysfunction of pelvic region: Secondary | ICD-10-CM

## 2017-12-21 DIAGNOSIS — R1032 Left lower quadrant pain: Secondary | ICD-10-CM

## 2017-12-21 DIAGNOSIS — M9903 Segmental and somatic dysfunction of lumbar region: Secondary | ICD-10-CM | POA: Diagnosis not present

## 2017-12-21 NOTE — Assessment & Plan Note (Signed)
Patellofemoral Syndrome  Reviewed anatomy using anatomical model and how PFS occurs.  Given rehab exercises handout for VMO, hip abductors, core, entire kinetic chain including proprioception exercises including cone touches, step downs, hip elevations and turn outs.  Could benefit from PT, regular exercise, upright biking, and a PFS knee brace to assist with tracking abnormalities.

## 2017-12-21 NOTE — Assessment & Plan Note (Signed)
Decision today to treat with OMT was based on Physical Exam  After verbal consent patient was treated with HVLA, ME, FPR techniques in cervical, thoracic,rib, hip lumbar and sacral areas  Patient tolerated the procedure well with improvement in symptoms  Patient given exercises, stretches and lifestyle modifications  See medications in patient instructions if given  Patient will follow up in 4-8 weeks

## 2017-12-21 NOTE — Patient Instructions (Addendum)
Good to see you  Andrea Ortiz is your friend Stay active.  Injected the hip and lets see how much improvement  New exercises for the knee  Xrays downstairs See me again in 4 weeks

## 2017-12-21 NOTE — Assessment & Plan Note (Signed)
Injected him today.  We will see how much this plays a role.  Discussed icing regimen and home exercises.  Discussed which activities to do which wants to avoid.  Continue to have pain consider an MR arthrogram for further evaluation for the labral tear.

## 2018-01-15 ENCOUNTER — Other Ambulatory Visit: Payer: Self-pay | Admitting: Family Medicine

## 2018-01-16 NOTE — Telephone Encounter (Signed)
Refill done.  

## 2018-01-25 DIAGNOSIS — Z01419 Encounter for gynecological examination (general) (routine) without abnormal findings: Secondary | ICD-10-CM | POA: Diagnosis not present

## 2018-01-25 DIAGNOSIS — Z1231 Encounter for screening mammogram for malignant neoplasm of breast: Secondary | ICD-10-CM | POA: Diagnosis not present

## 2018-01-25 DIAGNOSIS — Z124 Encounter for screening for malignant neoplasm of cervix: Secondary | ICD-10-CM | POA: Diagnosis not present

## 2018-01-25 NOTE — Progress Notes (Signed)
[facility] [address] [place], [place] [address] Phone: [phone] Subjective:    I Andrea Ortiz am serving as a Education administrator for Dr. Hulan Ortiz.   CC: Left hip pain  [id number]  Andrea Ortiz is a 57 y.o. female coming in with complaint of left hip pain. States that the hip feels good today. This past weekend she could barely walk on it. When she flexes her gluts. The pain is at its worse.  Patient had what potentially could have been a labral pathology of the hip.  Given an injection at last exam that did not make any significant improvement.      Past Medical History:  Diagnosis Date  . 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 Alcoholism in remission ([place])    doing well  2016   . Chest pain 02/09/2011  . EXANTHEM   . PALPITATIONS, RECURRENT   . PSVT   . Sleep apnea    mouth piece   Past Surgical History:  Procedure Laterality Date  . RHINOPLASTY  1980   Social History   Socioeconomic History  . Marital status: Married    Spouse name: Not on file  . Number of children: Not on file  . Years of education: Not on file  . Highest education level: Not on file  Occupational History  . Not on file  Social Needs  . Financial resource strain: Not on file  . Food insecurity:    Worry: Not on file    Inability: Not on file  . Transportation needs:    Medical: Not on file    Non-medical: Not on file  Tobacco Use  . Smoking status: Former Smoker    Packs/day: 1.50    Years: 15.00    Pack years: 22.50    Types: Cigarettes    Last attempt to quit: 08/12/1995    Years since quitting: 22.4  . Smokeless tobacco: Never Used  Substance and Sexual Activity  . Alcohol use: No    Alcohol/week: 5.0 standard drinks    Types: 5 Glasses of wine per week    Comment: quit   . Drug use: No  . Sexual activity: Not on file  Lifestyle  . Physical activity:    Days per week: Not on file    Minutes per session: Not on file  .  Stress: Not on file  Relationships  . Social connections:    Talks on phone: Not on file    Gets together: Not on file    Attends religious service: Not on file    Active member of club or organization: Not on file    Attends meetings of clubs or organizations: Not on file    Relationship status: Not on file  Other Topics Concern  . Not on file  Social History Narrative   Divorced- Remarried- Husband has a son at [place]   One child    Dog   Works [employer]      hhof 4  No ets.    Avoids caffiene etoh with dinner.   Exercise 5 d  Per week.    Sleep 6 hours or less    Allergies  Allergen Reactions  . Escitalopram Oxalate Swelling    hands swelled   Family History  Problem Relation Age of Onset  . Alzheimer's disease Mother   . Heart attack Mother        age 30's  . Parkinsonism  Father   . Other Sister        Hypoglycemia  . Malignant hyperthermia Brother   . Colon cancer Neg Hx       Current Outpatient Medications (Cardiovascular):  .  metoprolol (LOPRESSOR) 50 MG tablet, TAKE 1/2 TABLET BY MOUTH TWICE DAILY OR AS DIRECTED     Current Outpatient Medications (Analgesics):  .  meloxicam (MOBIC) 15 MG tablet, TAKE 1 TABLET BY MOUTH  DAILY     Current Outpatient Medications (Other):  .  desoximetasone (TOPICORT) 0.05 % cream, Apply topically 2 (two) times daily. For 2 weeks at a time  .  Diclofenac Sodium 2 % SOLN, Apply 1 pump twice daily. .  DULoxetine (CYMBALTA) 30 MG capsule, Take 90 mg by mouth daily.  Marland Kitchen  gabapentin (NEURONTIN) 300 MG capsule, TAKE 1 CAPSULE BY MOUTH 3  TIMES DAILY .  Vitamin D, Ergocalciferol, (DRISDOL) 50000 units CAPS capsule, TAKE 1 CAPSULE BY MOUTH  EVERY 7 DAYS  Current Facility-Administered Medications (Other):  .  0.9 %  sodium chloride infusion    Past medical history, social, surgical and family history all reviewed in electronic medical record.  No pertanent information unless stated regarding to the chief complaint.     Review of Systems:  No headache, visual changes, nausea, vomiting, diarrhea, constipation, dizziness, abdominal pain, skin rash, fevers, chills, night sweats, weight loss, swollen lymph nodes, body aches, joint swelling, chest pain, shortness of breath, mood changes.  Positive muscle aches  Objective  Blood pressure 110/80, pulse 71, height 5\' 6"  (1.676 m), weight 139 lb (63 kg), SpO2 93 %.    General: No apparent distress alert and oriented x3 mood and affect normal, dressed appropriately.  HEENT: Pupils equal, extraocular movements intact  Respiratory: Patient's speak in full sentences and does not appear short of breath  Cardiovascular: No lower extremity edema, non tender, no erythema  Skin: Warm dry intact with no signs of infection or rash on extremities or on axial skeleton.  Abdomen: Soft nontender  Neuro: Cranial nerves II through XII are intact, neurovascularly intact in all extremities with 2+ DTRs and 2+ pulses.  Lymph: No lymphadenopathy of posterior or anterior cervical chain or axillae bilaterally.  Gait normal with good balance and coordination.  MSK:  Non tender with full range of motion and good stability and symmetric strength and tone of shoulders, elbows, wrist,  knee and ankles bilaterally.  Left hip exam shows the patient does have only 10 degrees of internal rotation.  Still having discomfort and pain. Back Exam:  Inspection: Unremarkable  Motion: Flexion 45 deg, Extension 25 deg, Side Bending to 45 deg bilaterally,  Rotation to 45 deg bilaterally  SLR laying: Negative  XSLR laying: Negative  Palpable tenderness: Tender to palpation of the paraspinal musculature left greater than right near the sacrum. FABER: negative. Sensory change: Gross sensation intact to all lumbar and sacral dermatomes.  Reflexes: 2+ at both patellar tendons, 2+ at achilles tendons, Babinski's downgoing.  Strength at foot  Plantar-flexion: 5/5 Dorsi-flexion: 5/5 Eversion: 5/5 Inversion:  5/5  Leg strength  Quad: 5/5 Hamstring: 5/5 Hip flexor: 5/5 Hip abductors: 5/5  Gait unremarkable.    Osteopathic findings C2 flexed rotated and side bent right C4 flexed rotated and side bent left T5 extended rotated and side bent left inhaled rib L2 flexed rotated and side bent right Sacrum right on right  Impression and Recommendations:     This case required medical decision making of moderate complexity. The above documentation  has been reviewed and is accurate and complete Andrea Szeliga M Wendelin Reader, DO       Note: This dictation was prepared with Dragon dictation along with smaller phrase technology. Any transcriptional errors that result from this process are unintentional.         

## 2018-01-26 ENCOUNTER — Encounter: Payer: Self-pay | Admitting: Family Medicine

## 2018-01-26 ENCOUNTER — Ambulatory Visit: Payer: 59 | Admitting: Family Medicine

## 2018-01-26 VITALS — BP 110/80 | HR 71 | Ht 66.0 in | Wt 139.0 lb

## 2018-01-26 DIAGNOSIS — M9903 Segmental and somatic dysfunction of lumbar region: Secondary | ICD-10-CM

## 2018-01-26 DIAGNOSIS — M9904 Segmental and somatic dysfunction of sacral region: Secondary | ICD-10-CM

## 2018-01-26 DIAGNOSIS — M999 Biomechanical lesion, unspecified: Secondary | ICD-10-CM | POA: Diagnosis not present

## 2018-01-26 DIAGNOSIS — M9902 Segmental and somatic dysfunction of thoracic region: Secondary | ICD-10-CM

## 2018-01-26 DIAGNOSIS — M9901 Segmental and somatic dysfunction of cervical region: Secondary | ICD-10-CM

## 2018-01-26 DIAGNOSIS — R1032 Left lower quadrant pain: Secondary | ICD-10-CM

## 2018-01-26 DIAGNOSIS — M9908 Segmental and somatic dysfunction of rib cage: Secondary | ICD-10-CM

## 2018-01-26 NOTE — Assessment & Plan Note (Signed)
Attempted injection into the intra-articular hip with no significant improvement.  Patient seems to have more of a pelvic floor dysfunction or possibly a muscle injury.  Differential includes a possible hernia that could be missed.  We discussed the possibility of an MRI of the pelvis if this continues.  Patient wants to continue with the conservative therapy.  We discussed icing regimen and home exercises.  Patient responds well to manipulation.  Follow-up again in 4 to 6 weeks

## 2018-01-26 NOTE — Assessment & Plan Note (Signed)
Decision today to treat with OMT was based on Physical Exam  After verbal consent patient was treated with HVLA, ME, FPR techniques in cervical, thoracic, rib lumbar and sacral areas  Patient tolerated the procedure well with improvement in symptoms  Patient given exercises, stretches and lifestyle modifications  See medications in patient instructions if given  Patient will follow up in 6 weeks 

## 2018-01-26 NOTE — Patient Instructions (Addendum)
See me again in 6-12 weeks

## 2018-03-24 ENCOUNTER — Ambulatory Visit: Payer: Self-pay | Admitting: Family Medicine

## 2018-04-10 ENCOUNTER — Other Ambulatory Visit: Payer: Self-pay | Admitting: Family Medicine

## 2018-04-10 NOTE — Progress Notes (Signed)
[facility] [address] [place], [place] [address] Phone: [phone] Subjective:    I Andrea Ortiz am serving as a Education administrator for Dr. Hulan Saas.   I'm seeing this patient by the request  of:    CC: Left hip pain and back pain follow-up  [id number]  Andrea Ortiz is a 57 y.o. female coming in with complaint of hip pain. Hip is still painful. Wants to know if she needs an MRI. Injection in the past that did not help.  Patient has had x-rays of the hip that were unremarkable for any significant bony abnormality.  Patient though states that unfortunately continues to catch her.  Patient states that there is pain with internal and external rotation of the hip.  Sometimes a dull, throbbing aching pain even at night.  Rates the severity of pain is 7 out of 10.      Past Medical History:  Diagnosis Date  . 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 Alcoholism in remission ([place])    doing well  2016   . Chest pain 02/09/2011  . EXANTHEM   . PALPITATIONS, RECURRENT   . PSVT   . Sleep apnea    mouth piece   Past Surgical History:  Procedure Laterality Date  . RHINOPLASTY  1980   Social History   Socioeconomic History  . Marital status: Married    Spouse name: Not on file  . Number of children: Not on file  . Years of education: Not on file  . Highest education level: Not on file  Occupational History  . Not on file  Social Needs  . Financial resource strain: Not on file  . Food insecurity:    Worry: Not on file    Inability: Not on file  . Transportation needs:    Medical: Not on file    Non-medical: Not on file  Tobacco Use  . Smoking status: Former Smoker    Packs/day: 1.50    Years: 15.00    Pack years: 22.50    Types: Cigarettes    Last attempt to quit: 08/12/1995    Years since quitting: 22.6  . Smokeless tobacco: Never Used  Substance and Sexual Activity  . Alcohol use: No    Alcohol/week: 5.0 standard  drinks    Types: 5 Glasses of wine per week    Comment: quit   . Drug use: No  . Sexual activity: Not on file  Lifestyle  . Physical activity:    Days per week: Not on file    Minutes per session: Not on file  . Stress: Not on file  Relationships  . Social connections:    Talks on phone: Not on file    Gets together: Not on file    Attends religious service: Not on file    Active member of club or organization: Not on file    Attends meetings of clubs or organizations: Not on file    Relationship status: Not on file  Other Topics Concern  . Not on file  Social History Narrative   Divorced- Remarried- Husband has a son at [place]   One child    Dog   Works [employer]      hhof 4  No ets.    Avoids caffiene etoh with dinner.   Exercise 5 d  Per week.    Sleep 6 hours or less    Allergies  Allergen Reactions  .  Escitalopram Oxalate Swelling    hands swelled   Family History  Problem Relation Age of Onset  . Alzheimer's disease Mother   . Heart attack Mother        age 66's  . Parkinsonism Father   . Other Sister        Hypoglycemia  . Malignant hyperthermia Brother   . Colon cancer Neg Hx       Current Outpatient Medications (Cardiovascular):  .  metoprolol (LOPRESSOR) 50 MG tablet, TAKE 1/2 TABLET BY MOUTH TWICE DAILY OR AS DIRECTED     Current Outpatient Medications (Analgesics):  .  meloxicam (MOBIC) 15 MG tablet, TAKE 1 TABLET BY MOUTH  DAILY     Current Outpatient Medications (Other):  .  desoximetasone (TOPICORT) 0.05 % cream, Apply topically 2 (two) times daily. For 2 weeks at a time  .  Diclofenac Sodium 2 % SOLN, Apply 1 pump twice daily. .  DULoxetine (CYMBALTA) 30 MG capsule, Take 90 mg by mouth daily.  Marland Kitchen  gabapentin (NEURONTIN) 300 MG capsule, TAKE 1 CAPSULE BY MOUTH 3  TIMES DAILY .  Vitamin D, Ergocalciferol, (DRISDOL) 50000 units CAPS capsule, TAKE 1 CAPSULE BY MOUTH  EVERY 7 DAYS  Current Facility-Administered Medications  (Other):  .  0.9 %  sodium chloride infusion    Past medical history, social, surgical and family history all reviewed in electronic medical record.  No pertanent information unless stated regarding to the chief complaint.   Review of Systems:  No headache, visual changes, nausea, vomiting, diarrhea, constipation, dizziness, abdominal pain, skin rash, fevers, chills, night sweats, weight loss, swollen lymph nodes, body aches, joint swelling, muscle aches, chest pain, shortness of breath, mood changes.   Objective  Blood pressure 110/70, pulse 70, height 5\' 6"  (1.676 m), weight 138 lb (62.6 kg), SpO2 97 %.    General: No apparent distress alert and oriented x3 mood and affect normal, dressed appropriately.  HEENT: Pupils equal, extraocular movements intact  Respiratory: Patient's speak in full sentences and does not appear short of breath  Cardiovascular: No lower extremity edema, non tender, no erythema  Skin: Warm dry intact with no signs of infection or rash on extremities or on axial skeleton.  Abdomen: Soft nontender  Neuro: Cranial nerves II through XII are intact, neurovascularly intact in all extremities with 2+ DTRs and 2+ pulses.  Lymph: No lymphadenopathy of posterior or anterior cervical chain or axillae bilaterally.  Gait normal with good balance and coordination.  MSK:  Non tender with full range of motion and good stability and symmetric strength and tone of shoulders, elbows, wrist, , knee and ankles bilaterally.  Left hip exam has decreased range of motion especially with internal rotation has severe pain.  Patient though does have pain with resisted adduction of the hip.  Positive grind test noted.  Some mild loss of range of motion though noted compared to previous exam.  4+ out of 5 strength of the hip compared to the contralateral side.  Back exam shows the patient has some mild loss of lordosis.  Mild scoliosis noted.  Patient is tender to palpation in the left  paraspinal musculature of the lumbar spine and the left sacroiliac joint.  Mild pain with Corky Sox test but more in the groin area.   Osteopathic findings  C7 flexed rotated and side bent left T4 extended rotated and side bent right inhaled rib T6 extended rotated and side bent left L2 flexed rotated and side bent right Sacrum left on  left Left pelvic shear noted   Impression and Recommendations:     This case required medical decision making of moderate complexity. The above documentation has been reviewed and is accurate and complete Lyndal Pulley, DO       Note: This dictation was prepared with Dragon dictation along with smaller phrase technology. Any transcriptional errors that result from this process are unintentional.

## 2018-04-11 ENCOUNTER — Ambulatory Visit: Payer: 59 | Admitting: Family Medicine

## 2018-04-11 ENCOUNTER — Ambulatory Visit (INDEPENDENT_AMBULATORY_CARE_PROVIDER_SITE_OTHER)
Admission: RE | Admit: 2018-04-11 | Discharge: 2018-04-11 | Disposition: A | Discharge status: Home / Self Care | Payer: 59 | Source: Ambulatory Visit | Attending: Family Medicine | Admitting: Family Medicine

## 2018-04-11 ENCOUNTER — Encounter: Payer: Self-pay | Admitting: Family Medicine

## 2018-04-11 VITALS — BP 110/70 | HR 70 | Ht 66.0 in | Wt 138.0 lb

## 2018-04-11 DIAGNOSIS — M9902 Segmental and somatic dysfunction of thoracic region: Secondary | ICD-10-CM | POA: Diagnosis not present

## 2018-04-11 DIAGNOSIS — M9904 Segmental and somatic dysfunction of sacral region: Secondary | ICD-10-CM | POA: Diagnosis not present

## 2018-04-11 DIAGNOSIS — M25552 Pain in left hip: Secondary | ICD-10-CM

## 2018-04-11 DIAGNOSIS — M5442 Lumbago with sciatica, left side: Secondary | ICD-10-CM

## 2018-04-11 DIAGNOSIS — M9901 Segmental and somatic dysfunction of cervical region: Secondary | ICD-10-CM

## 2018-04-11 DIAGNOSIS — M9903 Segmental and somatic dysfunction of lumbar region: Secondary | ICD-10-CM

## 2018-04-11 DIAGNOSIS — M48061 Spinal stenosis, lumbar region without neurogenic claudication: Secondary | ICD-10-CM | POA: Diagnosis not present

## 2018-04-11 DIAGNOSIS — M9908 Segmental and somatic dysfunction of rib cage: Secondary | ICD-10-CM | POA: Diagnosis not present

## 2018-04-11 DIAGNOSIS — R1032 Left lower quadrant pain: Secondary | ICD-10-CM

## 2018-04-11 DIAGNOSIS — M1611 Unilateral primary osteoarthritis, right hip: Secondary | ICD-10-CM | POA: Diagnosis not present

## 2018-04-11 DIAGNOSIS — M999 Biomechanical lesion, unspecified: Secondary | ICD-10-CM

## 2018-04-11 NOTE — Telephone Encounter (Signed)
Refill done.  

## 2018-04-11 NOTE — Assessment & Plan Note (Signed)
Patient continues to stay active.  Discussed icing regimen and home exercises.  Discussed which activities to do which wants to avoid.  Increase activity as tolerated.  Follow-up again in 4 to 8 weeks

## 2018-04-11 NOTE — Assessment & Plan Note (Signed)
Decision today to treat with OMT was based on Physical Exam  After verbal consent patient was treated with HVLA, ME, FPR techniques in cervical, thoracic, lumbar and sacral pelvis, rib areas  Patient tolerated the procedure well with improvement in symptoms  Patient given exercises, stretches and lifestyle modifications  See medications in patient instructions if given  Patient will follow up in 6 weeks

## 2018-04-11 NOTE — Assessment & Plan Note (Signed)
Worsening left hip pain.  Patient did have an injection that gave her very small amount of resolution of pain initially.  Patient has had this pain for greater than 1 year at this time.  Seems to be worsening over the course of time.  X-rays were unremarkable but pain is out of proportion and concern for labral pathology with patient's symptoms today.  MRI ordered today for further evaluation and will do MR arthrogram for better visualization of the labrum.  Follow-up with me again in 6 weeks for her other issues.

## 2018-04-11 NOTE — Patient Instructions (Signed)
Good to see you  You are doing  MRI of hip ordered and call [phone] to schedule I will call you with the results Today go downstairs for xray  See me again for manipulation in 6-12 weeks

## 2018-04-12 ENCOUNTER — Other Ambulatory Visit: Payer: Self-pay

## 2018-04-12 DIAGNOSIS — M5416 Radiculopathy, lumbar region: Secondary | ICD-10-CM

## 2018-05-02 ENCOUNTER — Ambulatory Visit
Admission: RE | Admit: 2018-05-02 | Discharge: 2018-05-02 | Disposition: A | Discharge status: Home / Self Care | Payer: 59 | Source: Ambulatory Visit | Attending: Family Medicine | Admitting: Family Medicine

## 2018-05-02 DIAGNOSIS — M1612 Unilateral primary osteoarthritis, left hip: Secondary | ICD-10-CM | POA: Diagnosis not present

## 2018-05-02 DIAGNOSIS — M25552 Pain in left hip: Secondary | ICD-10-CM | POA: Diagnosis not present

## 2018-05-02 DIAGNOSIS — M48061 Spinal stenosis, lumbar region without neurogenic claudication: Secondary | ICD-10-CM | POA: Diagnosis not present

## 2018-05-02 DIAGNOSIS — M5416 Radiculopathy, lumbar region: Secondary | ICD-10-CM

## 2018-05-02 MED ORDER — IOPAMIDOL (ISOVUE-M 200) INJECTION 41%
12.0000 mL | Freq: Once | INTRAMUSCULAR | Status: AC
  Administered 2018-05-02: 12 mL via INTRA_ARTICULAR

## 2018-05-03 HISTORY — PX: HIP SURGERY: SHX245

## 2018-06-12 NOTE — Progress Notes (Signed)
[name] [address], [place] [address] Phone: [phone] Subjective:    I [name] am serving as a Education administrator for Dr. Hulan Saas.   I'm seeing this patient by the request  of:    CC: Left hip pain follow-up  [id number]     Updated 06/13/2018   Andrea Ortiz is a 58 y.o. female coming in with complaint of hip. States that she is in pain today. States that she is sore and that it is dependent on the weather.  Recently had an MRI.  Patient's MRI was independently visualized by me showing that patient did have advanced degenerative changes of the left hip with areas of full-thickness cartilage loss.  Patient also has significant edema or osteitis in the femoral head with degenerative changes of the labrum noted.      Past Medical History:  Diagnosis Date  . 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 Alcoholism in remission ([place])    doing well  2016   . Chest pain 02/09/2011  . EXANTHEM   . PALPITATIONS, RECURRENT   . PSVT   . Sleep apnea    mouth piece   Past Surgical History:  Procedure Laterality Date  . RHINOPLASTY  1980   Social History   Socioeconomic History  . Marital status: Married    Spouse name: Not on file  . Number of children: Not on file  . Years of education: Not on file  . Highest education level: Not on file  Occupational History  . Not on file  Social Needs  . Financial resource strain: Not on file  . Food insecurity:    Worry: Not on file    Inability: Not on file  . Transportation needs:    Medical: Not on file    Non-medical: Not on file  Tobacco Use  . Smoking status: Former Smoker    Packs/day: 1.50    Years: 15.00    Pack years: 22.50    Types: Cigarettes    Last attempt to quit: 08/12/1995    Years since quitting: 22.8  . Smokeless tobacco: Never Used  Substance and Sexual Activity  . Alcohol use: No    Alcohol/week: 5.0 standard drinks    Types: 5 Glasses of  wine per week    Comment: quit   . Drug use: No  . Sexual activity: Not on file  Lifestyle  . Physical activity:    Days per week: Not on file    Minutes per session: Not on file  . Stress: Not on file  Relationships  . Social connections:    Talks on phone: Not on file    Gets together: Not on file    Attends religious service: Not on file    Active member of club or organization: Not on file    Attends meetings of clubs or organizations: Not on file    Relationship status: Not on file  Other Topics Concern  . Not on file  Social History Narrative   Divorced- Remarried- Husband has a son at [place]   One child    Dog   Works [employer]      hhof 4  No ets.    Avoids caffiene etoh with dinner.   Exercise 5 d  Per week.    Sleep 6 hours or less    Allergies  Allergen Reactions  . Escitalopram Oxalate Swelling    hands swelled  Family History  Problem Relation Age of Onset  . Alzheimer's disease Mother   . Heart attack Mother        age 20's  . Parkinsonism Father   . Other Sister        Hypoglycemia  . Malignant hyperthermia Brother   . Colon cancer Neg Hx       Current Outpatient Medications (Cardiovascular):  .  metoprolol (LOPRESSOR) 50 MG tablet, TAKE 1/2 TABLET BY MOUTH TWICE DAILY OR AS DIRECTED     Current Outpatient Medications (Analgesics):  .  meloxicam (MOBIC) 15 MG tablet, TAKE 1 TABLET BY MOUTH  DAILY     Current Outpatient Medications (Other):  .  desoximetasone (TOPICORT) 0.05 % cream, Apply topically 2 (two) times daily. For 2 weeks at a time  .  Diclofenac Sodium 2 % SOLN, Apply 1 pump twice daily. .  DULoxetine (CYMBALTA) 30 MG capsule, Take 90 mg by mouth daily.  Marland Kitchen  gabapentin (NEURONTIN) 300 MG capsule, TAKE 1 CAPSULE BY MOUTH 3  TIMES DAILY .  Vitamin D, Ergocalciferol, (DRISDOL) 50000 units CAPS capsule, TAKE 1 CAPSULE BY MOUTH  EVERY 7 DAYS  Current Facility-Administered Medications (Other):  .  0.9 %  sodium chloride  infusion    Past medical history, social, surgical and family history all reviewed in electronic medical record.  No pertanent information unless stated regarding to the chief complaint.   Review of Systems:  No headache, visual changes, nausea, vomiting, diarrhea, constipation, dizziness, abdominal pain, skin rash, fevers, chills, night sweats, weight loss, swollen lymph nodes, body aches, joint swelling,  chest pain, shortness of breath, mood changes.  Positive muscle aches  Objective  Blood pressure 122/70, pulse 73, height 5\' 6"  (1.676 m), weight 139 lb (63 kg), SpO2 96 %.  General: No apparent distress alert and oriented x3 mood and affect normal, dressed appropriately.  HEENT: Pupils equal, extraocular movements intact  Respiratory: Patient's speak in full sentences and does not appear short of breath  Cardiovascular: No lower extremity edema, non tender, no erythema  Skin: Warm dry intact with no signs of infection or rash on extremities or on axial skeleton.  Abdomen: Soft nontender  Neuro: Cranial nerves II through XII are intact, neurovascularly intact in all extremities with 2+ DTRs and 2+ pulses.  Lymph: No lymphadenopathy of posterior or anterior cervical chain or axillae bilaterally.  Gait mild abnormality in gait.  MSK: Mild tender with full range of motion and good stability and symmetric strength and tone of shoulders, elbows, wrist, , knee and ankles bilaterally.  Negative hip exam still shows the patient has good musculature.  Full flexion noted.  Limited in extension and internal range of motion significantly more than previous exam.  Pain in the groin with internal range of motion.    Impression and Recommendations:     This case required medical decision making of moderate complexity. The above documentation has been reviewed and is accurate and complete Lyndal Pulley, DO       Note: This dictation was prepared with Dragon dictation along with smaller phrase  technology. Any transcriptional errors that result from this process are unintentional.

## 2018-06-13 ENCOUNTER — Encounter: Payer: Self-pay | Admitting: Family Medicine

## 2018-06-13 ENCOUNTER — Ambulatory Visit: Payer: 59 | Admitting: Family Medicine

## 2018-06-13 VITALS — BP 122/70 | HR 73 | Ht 66.0 in | Wt 139.0 lb

## 2018-06-13 DIAGNOSIS — M1612 Unilateral primary osteoarthritis, left hip: Secondary | ICD-10-CM | POA: Diagnosis not present

## 2018-06-13 DIAGNOSIS — M25559 Pain in unspecified hip: Secondary | ICD-10-CM | POA: Diagnosis not present

## 2018-06-13 NOTE — Assessment & Plan Note (Signed)
Patient had moderate arthritis on x-rays but does have severe arthritis on MRI.  Degenerative labral tear noted as well.  We discussed with patient in great length.  Discussed different treatment options.  We discussed the possibility of repeating the corticosteroid injection or even the possibility of PRP.  At this point the patient would like to consider surgical intervention.  Patient is very active and wants to continue to do so.  Limited secondary to patient's young age patient would like to consider replacement and will be referred today.  Spent  25 minutes with patient face-to-face and had greater than 50% of counseling including as described above in assessment and plan.

## 2018-06-13 NOTE — Patient Instructions (Signed)
Good to see you  Dr. Rhona Raider at [facility]  [phone] but they should call you  [facility] They should call you soon  Ice is your friend Keep being active for now I am here if you need me

## 2018-06-23 ENCOUNTER — Other Ambulatory Visit: Payer: Self-pay | Admitting: Orthopaedic Surgery

## 2018-06-23 DIAGNOSIS — M1612 Unilateral primary osteoarthritis, left hip: Secondary | ICD-10-CM | POA: Diagnosis not present

## 2018-07-04 ENCOUNTER — Other Ambulatory Visit: Payer: Self-pay | Admitting: Family Medicine

## 2018-08-08 ENCOUNTER — Encounter (HOSPITAL_COMMUNITY): Admission: RE | Payer: Self-pay | Source: Home / Self Care

## 2018-08-08 ENCOUNTER — Inpatient Hospital Stay (HOSPITAL_COMMUNITY): Admission: RE | Admit: 2018-08-08 | Payer: 59 | Source: Home / Self Care | Admitting: Orthopaedic Surgery

## 2018-08-08 SURGERY — ARTHROPLASTY, HIP, TOTAL, ANTERIOR APPROACH
Anesthesia: Spinal | Laterality: Left

## 2018-09-21 ENCOUNTER — Other Ambulatory Visit: Payer: Self-pay | Admitting: Family Medicine

## 2018-09-21 ENCOUNTER — Other Ambulatory Visit: Payer: Self-pay | Admitting: Internal Medicine

## 2018-09-28 ENCOUNTER — Other Ambulatory Visit: Payer: Self-pay

## 2018-09-28 ENCOUNTER — Other Ambulatory Visit: Payer: Self-pay | Admitting: Family Medicine

## 2018-09-28 NOTE — Telephone Encounter (Signed)
Both medications have been refilled May 2020 to [facility].

## 2018-12-10 ENCOUNTER — Other Ambulatory Visit: Payer: Self-pay | Admitting: Family Medicine

## 2019-01-22 ENCOUNTER — Other Ambulatory Visit: Payer: Self-pay

## 2019-01-22 ENCOUNTER — Ambulatory Visit (INDEPENDENT_AMBULATORY_CARE_PROVIDER_SITE_OTHER): Payer: 59

## 2019-01-22 DIAGNOSIS — Z23 Encounter for immunization: Secondary | ICD-10-CM

## 2019-01-22 DIAGNOSIS — Z299 Encounter for prophylactic measures, unspecified: Secondary | ICD-10-CM

## 2019-02-09 ENCOUNTER — Other Ambulatory Visit: Payer: Self-pay

## 2019-02-09 DIAGNOSIS — Z20822 Contact with and (suspected) exposure to covid-19: Secondary | ICD-10-CM

## 2019-02-10 LAB — NOVEL CORONAVIRUS, NAA: SARS-CoV-2, NAA: NOT DETECTED

## 2019-04-04 ENCOUNTER — Ambulatory Visit (INDEPENDENT_AMBULATORY_CARE_PROVIDER_SITE_OTHER): Payer: 59 | Admitting: *Deleted

## 2019-04-04 ENCOUNTER — Other Ambulatory Visit: Payer: Self-pay

## 2019-04-04 DIAGNOSIS — Z23 Encounter for immunization: Secondary | ICD-10-CM

## 2019-08-02 ENCOUNTER — Encounter: Payer: Self-pay | Admitting: Gastroenterology

## 2019-08-23 LAB — BASIC METABOLIC PANEL
BUN: 11 (ref 4–21)
CO2: 25 — AB (ref 13–22)
Chloride: 98 — AB (ref 99–108)
Creatinine: 0.9 (ref <=1.1)
Glucose: 94
Potassium: 4.3 (ref 3.4–5.3)
Sodium: 137 (ref 137–147)

## 2019-08-23 LAB — LIPID PANEL
Cholesterol: 355 — AB (ref 0–200)
HDL: 118 — AB (ref 35–70)
LDL Cholesterol: 232
Triglycerides: 53 (ref 40–160)

## 2019-08-23 LAB — CBC AND DIFFERENTIAL
HCT: 36 (ref 36–46)
Hemoglobin: 12.5 (ref 12.0–16.0)
Neutrophils Absolute: 39
Platelets: 316 (ref 150–399)
WBC: 3.6

## 2019-08-23 LAB — COMPREHENSIVE METABOLIC PANEL
Albumin: 4.7 (ref 3.5–5.0)
Calcium: 9.5 (ref 8.7–10.7)
Globulin: 1.8

## 2019-08-23 LAB — VITAMIN B12: Vitamin B-12: 2000

## 2019-08-23 LAB — HEPATIC FUNCTION PANEL
ALT: 16 (ref 7–35)
AST: 31 (ref 13–35)
Alkaline Phosphatase: 45 (ref 25–125)
Bilirubin, Total: 0.4

## 2019-08-23 LAB — CBC: RBC: 3.88 (ref 3.87–5.11)

## 2019-08-23 LAB — VITAMIN D 25 HYDROXY (VIT D DEFICIENCY, FRACTURES): Vit D, 25-Hydroxy: 55.8

## 2019-08-23 LAB — TSH: TSH: 1.6 (ref <=5.90)

## 2019-08-31 ENCOUNTER — Telehealth: Payer: Self-pay

## 2019-08-31 NOTE — Telephone Encounter (Signed)
Called patient and left a detailed voice message for patient to call us back to schedule a physical or in office follow up concerning some lab results we received from [place].

## 2019-09-10 ENCOUNTER — Ambulatory Visit (AMBULATORY_SURGERY_CENTER): Payer: Self-pay

## 2019-09-10 ENCOUNTER — Other Ambulatory Visit: Payer: Self-pay

## 2019-09-10 VITALS — Temp 97.3°F | Ht 66.5 in | Wt 137.0 lb

## 2019-09-10 DIAGNOSIS — Z86010 Personal history of colonic polyps: Secondary | ICD-10-CM

## 2019-09-10 MED ORDER — SUTAB 1479-225-188 MG PO TABS: 12.0000 | ORAL_TABLET | ORAL | 0 refills | Status: DC

## 2019-09-10 NOTE — Progress Notes (Signed)
No allergies to soy or egg Pt is not on blood thinners or diet pills Denies issues with sedation/intubation Denies atrial flutter/fib Denies constipation   Emmi instructions given to pt  Pt is aware of Covid safety and care partner requirements.  

## 2019-09-12 NOTE — Progress Notes (Deleted)
This visit occurred during the SARS-CoV-2 public health emergency.  Safety protocols were in place, including screening questions prior to the visit, additional usage of staff PPE, and extensive cleaning of exam room while observing appropriate contact time as indicated for disinfecting solutions.    No chief complaint on file.   HPI: Patient  Andrea Ortiz  59 y.o. comes in today for Preventive Health Care visit   Health Maintenance  Topic Date Due  . Hepatitis C Screening  Never done  . HIV Screening  Never done  . COVID-19 Vaccine (1) Never done  . MAMMOGRAM  10/08/2016  . COLONOSCOPY  06/22/2019  . INFLUENZA VACCINE  12/02/2019  . PAP SMEAR-Modifier  02/08/2020  . TETANUS/TDAP  01/06/2025   Health Maintenance Review LIFESTYLE:  Exercise:   Tobacco/ETS: Alcohol:  Sugar beverages: Sleep: Drug use: no HH of  Work:    ROS:  GEN/ HEENT: No fever, significant weight changes sweats headaches vision problems hearing changes, CV/ PULM; No chest pain shortness of breath cough, syncope,edema  change in exercise tolerance. GI /GU: No adominal pain, vomiting, change in bowel habits. No blood in the stool. No significant GU symptoms. SKIN/HEME: ,no acute skin rashes suspicious lesions or bleeding. No lymphadenopathy, nodules, masses.  NEURO/ PSYCH:  No neurologic signs such as weakness numbness. No depression anxiety. IMM/ Allergy: No unusual infections.  Allergy .   REST of 12 system review negative except as per HPI   Past Medical History:  Diagnosis Date  . 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 Alcoholism in remission ([place])    doing well  2016   . Anxiety   . Chest pain 02/09/2011  . Depression   . EXANTHEM   . PALPITATIONS, RECURRENT   . PSVT   . Sleep apnea    mouth piece  . Substance abuse ([place])    Alcoholism    Past Surgical History:  Procedure Laterality Date  . COLONOSCOPY  2018  . HIP SURGERY  2020  . RHINOPLASTY  1980     Family History  Problem Relation Age of Onset  . Alzheimer's disease Mother   . Heart attack Mother        age 33's  . Parkinsonism Father   . Other Sister        Hypoglycemia  . Malignant hyperthermia Brother   . Colon cancer Neg Hx   . Colon polyps Neg Hx   . Esophageal cancer Neg Hx   . Rectal cancer Neg Hx   . Stomach cancer Neg Hx     Social History   Socioeconomic History  . Marital status: Married    Spouse name: Not on file  . Number of children: Not on file  . Years of education: Not on file  . Highest education level: Not on file  Occupational History  . Not on file  Tobacco Use  . Smoking status: Former Smoker    Packs/day: 1.50    Years: 15.00    Pack years: 22.50    Types: Cigarettes    Quit date: 08/12/1995    Years since quitting: 24.1  . Smokeless tobacco: Never Used  Substance and Sexual Activity  . Alcohol use: No    Alcohol/week: 5.0 standard drinks    Types: 5 Glasses of wine per week    Comment: quit   . Drug use: No  . Sexual activity: Not on file  Other Topics Concern  . Not on file  Social History Narrative   Divorced- Remarried- Husband has a son at [place]   One child    Dog   Works [employer]      hhof 4  No ets.    Avoids caffiene etoh with dinner.   Exercise 5 d  Per week.    Sleep 6 hours or less    Social Determinants of Health   Financial Resource Strain:   . Difficulty of Paying Living Expenses:   Food Insecurity:   . Worried About Charity fundraiser in the Last Year:   . Arboriculturist in the Last Year:   Transportation Needs:   . Film/video editor (Medical):   Marland Kitchen Lack of Transportation (Non-Medical):   Physical Activity:   . Days of Exercise per Week:   . Minutes of Exercise per Session:   Stress:   . Feeling of Stress :   Social Connections:   . Frequency of Communication with Friends and Family:   . Frequency of Social Gatherings with Friends and Family:   . Attends Religious Services:   .  Active Member of Clubs or Organizations:   . Attends Archivist Meetings:   Marland Kitchen Marital Status:     Outpatient Medications Prior to Visit  Medication Sig Dispense Refill  . clobetasol cream (TEMOVATE) 0.05 % clobetasol 0.05 % topical cream    . desoximetasone (TOPICORT) 0.05 % cream Apply topically 2 (two) times daily. For 2 weeks at a time     . DULoxetine (CYMBALTA) 30 MG capsule Take 90 mg by mouth daily.     Marland Kitchen gabapentin (NEURONTIN) 300 MG capsule TAKE 1 CAPSULE BY MOUTH 3  TIMES DAILY 270 capsule 0  . LATUDA 40 MG TABS tablet Take 40 mg by mouth at bedtime.    . meloxicam (MOBIC) 15 MG tablet TAKE 1 TABLET BY MOUTH  DAILY 90 tablet 3  . metoprolol tartrate (LOPRESSOR) 50 MG tablet TAKE ONE-HALF TABLET BY  MOUTH TWICE DAILY OR AS  DIRECTED 30 tablet 0  . Sodium Sulfate-Mag Sulfate-KCl (SUTAB) [phone] MG TABS Take 12 tablets by mouth as directed. 24 tablet 0  . Vitamin D, Ergocalciferol, (DRISDOL) 1.25 MG (50000 UT) CAPS capsule TAKE 1 CAPSULE BY MOUTH  EVERY 7 DAYS 12 capsule 0   Facility-Administered Medications Prior to Visit  Medication Dose Route Frequency Provider Last Rate Last Admin  . 0.9 %  sodium chloride infusion  500 mL Intravenous Continuous Armbruster, Carlota Raspberry, MD         EXAM:  There were no vitals taken for this visit.  There is no height or weight on file to calculate BMI. Wt Readings from Last 3 Encounters:  09/10/19 137 lb (62.1 kg)  06/13/18 139 lb (63 kg)  04/11/18 138 lb (62.6 kg)    Physical Exam: Vital signs reviewed [id number] is a well-developed well-nourished alert cooperative    who appearsr stated age in no acute distress.  HEENT: normocephalic atraumatic , Eyes: PERRL EOM's full, conjunctiva clear, Nares: paten,t no deformity discharge or tenderness., Ears: no deformity EAC's clear TMs with normal landmarks. Mouth: clear OP, no lesions, edema.  Moist mucous membranes. Dentition in adequate repair. NECK: supple without masses,  thyromegaly or bruits. CHEST/PULM:  Clear to auscultation and percussion breath sounds equal no wheeze , rales or rhonchi. No chest wall deformities or tenderness. Breast: normal by inspection . No dimpling, discharge, masses, tenderness or discharge . CV: PMI is nondisplaced, S1 S2 no gallops,  murmurs, rubs. Peripheral pulses are full without delay.No JVD .  ABDOMEN: Bowel sounds normal nontender  No guard or rebound, no hepato splenomegal no CVA tenderness.  No hernia. Extremtities:  No clubbing cyanosis or edema, no acute joint swelling or redness no focal atrophy NEURO:  Oriented x3, cranial nerves 3-12 appear to be intact, no obvious focal weakness,gait within normal limits no abnormal reflexes or asymmetrical SKIN: No acute rashes normal turgor, color, no bruising or petechiae. PSYCH: Oriented, good eye contact, no obvious depression anxiety, cognition and judgment appear normal. LN: no cervical axillary inguinal adenopathy  Lab Results  Component Value Date   WBC 3.6 08/23/2019   HGB 12.5 08/23/2019   HCT 36 08/23/2019   PLT 316 08/23/2019   GLUCOSE 77 12/15/2017   CHOL 355 (A) 08/23/2019   TRIG 53 08/23/2019   HDL 118 (A) 08/23/2019   LDLDIRECT 155.4 06/07/2013   LDLCALC 232 08/23/2019   ALT 16 08/23/2019   AST 31 08/23/2019   NA 137 08/23/2019   K 4.3 08/23/2019   CL 98 (A) 08/23/2019   CREATININE 0.9 08/23/2019   BUN 11 08/23/2019   CO2 25 (A) 08/23/2019   TSH 1.60 08/23/2019   HGBA1C 6.1 12/15/2017    BP Readings from Last 3 Encounters:  06/13/18 122/70  04/11/18 110/70  01/26/18 110/80    Lab results reviewed with patient   ASSESSMENT AND PLAN:  Discussed the following assessment and plan:  No diagnosis found. No follow-ups on file.  Patient Care Team: Shuntay Everetts, Standley Brooking, MD as PCP - General Josue Hector, MD as Attending Physician (Cardiology) Gus Height, MD (Inactive) as Consulting Physician (Obstetrics and Gynecology) Dan Humphreys (Nurse  Practitioner) Lyndal Pulley, DO as Attending Physician (Family Medicine) Rigoberto Noel, MD as Consulting Physician (Pulmonary Disease) There are no Patient Instructions on file for this visit.  Standley Brooking. Khris Jansson M.D.

## 2019-09-13 ENCOUNTER — Other Ambulatory Visit: Payer: Self-pay

## 2019-09-14 ENCOUNTER — Encounter: Payer: 59 | Admitting: Internal Medicine

## 2019-09-17 ENCOUNTER — Encounter: Payer: Self-pay | Admitting: Gastroenterology

## 2019-09-24 ENCOUNTER — Other Ambulatory Visit: Payer: Self-pay

## 2019-09-24 ENCOUNTER — Ambulatory Visit (AMBULATORY_SURGERY_CENTER): Payer: 59 | Admitting: Gastroenterology

## 2019-09-24 ENCOUNTER — Encounter: Payer: Self-pay | Admitting: Gastroenterology

## 2019-09-24 VITALS — BP 124/83 | HR 69 | Temp 97.1°F | Resp 13 | Ht 66.0 in | Wt 137.0 lb

## 2019-09-24 DIAGNOSIS — D120 Benign neoplasm of cecum: Secondary | ICD-10-CM | POA: Diagnosis not present

## 2019-09-24 DIAGNOSIS — D123 Benign neoplasm of transverse colon: Secondary | ICD-10-CM | POA: Diagnosis not present

## 2019-09-24 DIAGNOSIS — D122 Benign neoplasm of ascending colon: Secondary | ICD-10-CM | POA: Diagnosis not present

## 2019-09-24 DIAGNOSIS — Z86010 Personal history of colonic polyps: Secondary | ICD-10-CM

## 2019-09-24 MED ORDER — SODIUM CHLORIDE 0.9 % IV SOLN: 500.0000 mL | Freq: Once | INTRAVENOUS | Status: DC

## 2019-09-24 NOTE — Progress Notes (Signed)
Called to room to assist during endoscopic procedure.  Patient ID and intended procedure confirmed with present staff. Received instructions for my participation in the procedure from the performing physician.  

## 2019-09-24 NOTE — Progress Notes (Signed)
A/ox3, pleased with MAC, report to RN 

## 2019-09-24 NOTE — Progress Notes (Signed)
Pt's states no medical or surgical changes since previsit or office visit. 

## 2019-09-24 NOTE — Patient Instructions (Signed)
Handout provided on polyps.   YOU HAD AN ENDOSCOPIC PROCEDURE TODAY AT THE [facility]:   Refer to the procedure report that was given to you for any specific questions about what was found during the examination.  If the procedure report does not answer your questions, please call your gastroenterologist to clarify.  If you requested that your care partner not be given the details of your procedure findings, then the procedure report has been included in a sealed envelope for you to review at your convenience later.  YOU SHOULD EXPECT: Some feelings of bloating in the abdomen. Passage of more gas than usual.  Walking can help get rid of the air that was put into your GI tract during the procedure and reduce the bloating. If you had a lower endoscopy (such as a colonoscopy or flexible sigmoidoscopy) you may notice spotting of blood in your stool or on the toilet paper. If you underwent a bowel prep for your procedure, you may not have a normal bowel movement for a few days.  Please Note:  You might notice some irritation and congestion in your nose or some drainage.  This is from the oxygen used during your procedure.  There is no need for concern and it should clear up in a day or so.  SYMPTOMS TO REPORT IMMEDIATELY:  Following lower endoscopy (colonoscopy or flexible sigmoidoscopy):  Excessive amounts of blood in the stool  Significant tenderness or worsening of abdominal pains  Swelling of the abdomen that is new, acute  Fever of 100F or higher  For urgent or emergent issues, a gastroenterologist can be reached at any hour by calling [phone]. Do not use MyChart messaging for urgent concerns.    DIET:  We do recommend a small meal at first, but then you may proceed to your regular diet.  Drink plenty of fluids but you should avoid alcoholic beverages for 24 hours.  ACTIVITY:  You should plan to take it easy for the rest of today and you should NOT DRIVE or use heavy  machinery until tomorrow (because of the sedation medicines used during the test).    FOLLOW UP: Our staff will call the number listed on your records 48-72 hours following your procedure to check on you and address any questions or concerns that you may have regarding the information given to you following your procedure. If we do not reach you, we will leave a message.  We will attempt to reach you two times.  During this call, we will ask if you have developed any symptoms of COVID 19. If you develop any symptoms (ie: fever, flu-like symptoms, shortness of breath, cough etc.) before then, please call [phone].  If you test positive for Covid 19 in the 2 weeks post procedure, please call and report this information to us.    If any biopsies were taken you will be contacted by phone or by letter within the next 1-3 weeks.  Please call us at [phone] if you have not heard about the biopsies in 3 weeks.    SIGNATURES/CONFIDENTIALITY: You and/or your care partner have signed paperwork which will be entered into your electronic medical record.  These signatures attest to the fact that that the information above on your After Visit Summary has been reviewed and is understood.  Full responsibility of the confidentiality of this discharge information lies with you and/or your care-partner.  

## 2019-09-24 NOTE — Addendum Note (Signed)
Addended by: Dustin Flock T on: 09/24/2019 12:36 PM   Modules accepted: Orders

## 2019-09-24 NOTE — Op Note (Signed)
[place] Patient Name: Andrea Ortiz Procedure Date: 09/24/2019 10:33 AM MRN: [id number] Endoscopist: Remo Lipps P. Havery Moros , MD Age: 59 Referring MD:  Date of Birth: 08-01-1960 Gender: Female Account #: [id number] Procedure:                Colonoscopy Indications:              High risk colon cancer surveillance: Personal                            history of colonic polyps (8 SSPs / adenomas in                            2018) Medicines:                Monitored Anesthesia Care Procedure:                Pre-Anesthesia Assessment:                           - Prior to the procedure, a History and Physical                            was performed, and patient medications and                            allergies were reviewed. The patient's tolerance of                            previous anesthesia was also reviewed. The risks                            and benefits of the procedure and the sedation                            options and risks were discussed with the patient.                            All questions were answered, and informed consent                            was obtained. Prior Anticoagulants: The patient has                            taken no previous anticoagulant or antiplatelet                            agents. ASA Grade Assessment: III - A patient with                            severe systemic disease. After reviewing the risks                            and benefits, the patient was deemed in  satisfactory condition to undergo the procedure.                           After obtaining informed consent, the colonoscope                            was passed under direct vision. Throughout the                            procedure, the patient's blood pressure, pulse, and                            oxygen saturations were monitored continuously. The                            Colonoscope was introduced through the anus and                             advanced to the the cecum, identified by                            appendiceal orifice and ileocecal valve. The                            colonoscopy was performed without difficulty. The                            patient tolerated the procedure well. The quality                            of the bowel preparation was adequate. The                            ileocecal valve, appendiceal orifice, and rectum                            were photographed. Scope In: 10:39:53 AM Scope Out: 11:15:26 AM Scope Withdrawal Time: 0 hours 28 minutes 3 seconds  Total Procedure Duration: 0 hours 35 minutes 33 seconds  Findings:                 The perianal and digital rectal examinations were                            normal.                           Two flat polyps were found in the cecum. The polyps                            were 3 to 4 mm in size. These polyps were removed                            with a cold snare. Resection and retrieval were  complete.                           Three flat polyps were found in the ascending                            colon. The polyps were 3 to 4 mm in size. These                            polyps were removed with a cold snare. Resection                            and retrieval were complete.                           Three flat and sessile polyps were found in the                            transverse colon. The polyps were 3 to 7 mm in                            size. These polyps were removed with a cold snare.                            Resection and retrieval were complete.                           The colon was quite tortuous. Several minutes spent                            lavaging the colon to achieve adequate views.                           The exam was otherwise without abnormality. Complications:            No immediate complications. Estimated blood loss:                             Minimal. Estimated Blood Loss:     Estimated blood loss was minimal. Impression:               - Two 3 to 4 mm polyps in the cecum, removed with a                            cold snare. Resected and retrieved.                           - Three 3 to 4 mm polyps in the ascending colon,                            removed with a cold snare. Resected and retrieved.                           - Three 3 to  7 mm polyps in the transverse colon,                            removed with a cold snare. Resected and retrieved.                           - Tortuous colon.                           - The examination was otherwise normal. Recommendation:           - Patient has a contact number available for                            emergencies. The signs and symptoms of potential                            delayed complications were discussed with the                            patient. Return to normal activities tomorrow.                            Written discharge instructions were provided to the                            patient.                           - Resume previous diet.                           - Continue present medications.                           - Await pathology results. Remo Lipps P. Roxsana Riding, MD 09/24/2019 11:19:57 AM This report has been signed electronically.

## 2019-09-26 ENCOUNTER — Telehealth: Payer: Self-pay

## 2019-09-26 NOTE — Telephone Encounter (Signed)
  Follow up Call-  Call back number 09/24/2019  Post procedure Call Back phone  # [phone]  Permission to leave phone message Yes  Some recent data might be hidden     Patient questions:  Do you have a fever, pain , or abdominal swelling? No. Pain Score  0 *  Have you tolerated food without any problems? Yes.    Have you been able to return to your normal activities? Yes.    Do you have any questions about your discharge instructions: Diet   No. Medications  No. Follow up visit  No.  Do you have questions or concerns about your Care? No.  Actions: * If pain score is 4 or above: No action needed, pain <4. 1. Have you developed a fever since your procedure? no  2.   Have you had an respiratory symptoms (SOB or cough) since your procedure? no  3.   Have you tested positive for COVID 19 since your procedure no  4.   Have you had any family members/close contacts diagnosed with the COVID 19 since your procedure?  no   If yes to any of these questions please route to Joylene John, RN and Erenest Rasher, RN

## 2019-11-21 ENCOUNTER — Encounter: Payer: 59 | Admitting: Internal Medicine

## 2020-11-21 ENCOUNTER — Telehealth: Payer: Self-pay | Admitting: Family Medicine

## 2020-11-21 NOTE — Telephone Encounter (Signed)
Patient called wanting to see if Dr Tamala Julian has any recommendations on someone her and her husband could see while they are living in [place], [place] that would be similar to him.  Please advise.Marland Kitchen

## 2020-11-24 NOTE — Telephone Encounter (Signed)
Informed patient

## 2022-09-02 ENCOUNTER — Encounter: Payer: Self-pay | Admitting: Gastroenterology
